# Patient Record
Sex: Female | Born: 1970 | Race: White | Hispanic: No | Marital: Married | Smoking: Never smoker
Health system: Southern US, Community
[De-identification: ages and names within clinical notes are randomized; demographics above are authoritative.]

## PROBLEM LIST (undated history)

## (undated) DIAGNOSIS — IMO0001 Reserved for inherently not codable concepts without codable children: Secondary | ICD-10-CM

## (undated) DIAGNOSIS — R51 Headache: Secondary | ICD-10-CM

## (undated) DIAGNOSIS — Z86718 Personal history of other venous thrombosis and embolism: Secondary | ICD-10-CM

## (undated) DIAGNOSIS — Z5189 Encounter for other specified aftercare: Secondary | ICD-10-CM

## (undated) DIAGNOSIS — R32 Unspecified urinary incontinence: Secondary | ICD-10-CM

## (undated) DIAGNOSIS — K589 Irritable bowel syndrome without diarrhea: Secondary | ICD-10-CM

## (undated) DIAGNOSIS — R112 Nausea with vomiting, unspecified: Secondary | ICD-10-CM

## (undated) DIAGNOSIS — N920 Excessive and frequent menstruation with regular cycle: Principal | ICD-10-CM

## (undated) DIAGNOSIS — S93699A Other sprain of unspecified foot, initial encounter: Secondary | ICD-10-CM

## (undated) DIAGNOSIS — M1990 Unspecified osteoarthritis, unspecified site: Secondary | ICD-10-CM

## (undated) DIAGNOSIS — F32A Depression, unspecified: Secondary | ICD-10-CM

## (undated) DIAGNOSIS — F419 Anxiety disorder, unspecified: Secondary | ICD-10-CM

## (undated) DIAGNOSIS — N3281 Overactive bladder: Secondary | ICD-10-CM

## (undated) DIAGNOSIS — K219 Gastro-esophageal reflux disease without esophagitis: Secondary | ICD-10-CM

## (undated) DIAGNOSIS — Z9889 Other specified postprocedural states: Secondary | ICD-10-CM

## (undated) DIAGNOSIS — M6701 Short Achilles tendon (acquired), right ankle: Secondary | ICD-10-CM

## (undated) DIAGNOSIS — N840 Polyp of corpus uteri: Secondary | ICD-10-CM

## (undated) DIAGNOSIS — Z8489 Family history of other specified conditions: Secondary | ICD-10-CM

## (undated) DIAGNOSIS — F329 Major depressive disorder, single episode, unspecified: Secondary | ICD-10-CM

## (undated) DIAGNOSIS — G2581 Restless legs syndrome: Secondary | ICD-10-CM

## (undated) HISTORY — DX: Anxiety disorder, unspecified: F41.9

## (undated) HISTORY — DX: Encounter for other specified aftercare: Z51.89

## (undated) HISTORY — PX: TUBAL LIGATION: SHX77

## (undated) HISTORY — PX: HERNIA REPAIR: SHX51

## (undated) HISTORY — DX: Headache: R51

## (undated) HISTORY — DX: Excessive and frequent menstruation with regular cycle: N92.0

## (undated) HISTORY — DX: Restless legs syndrome: G25.81

## (undated) HISTORY — PX: COLPOSCOPY: SHX161

## (undated) HISTORY — PX: CHOLECYSTECTOMY: SHX55

## (undated) HISTORY — PX: ENDOMETRIAL ABLATION: SHX621

## (undated) HISTORY — DX: Gastro-esophageal reflux disease without esophagitis: K21.9

## (undated) HISTORY — DX: Unspecified urinary incontinence: R32

## (undated) HISTORY — DX: Polyp of corpus uteri: N84.0

---

## 2000-10-16 ENCOUNTER — Encounter: Admission: RE | Admit: 2000-10-16 | Discharge: 2000-10-16 | Payer: Self-pay | Admitting: Internal Medicine

## 2000-10-16 ENCOUNTER — Encounter: Payer: Self-pay | Admitting: Internal Medicine

## 2004-04-25 ENCOUNTER — Other Ambulatory Visit: Admission: RE | Admit: 2004-04-25 | Discharge: 2004-04-25 | Payer: Self-pay | Admitting: Family Medicine

## 2005-06-12 ENCOUNTER — Other Ambulatory Visit: Admission: RE | Admit: 2005-06-12 | Discharge: 2005-06-12 | Payer: Self-pay | Admitting: Family Medicine

## 2007-01-29 ENCOUNTER — Encounter: Admission: RE | Admit: 2007-01-29 | Discharge: 2007-01-29 | Payer: Self-pay | Admitting: Family Medicine

## 2010-05-07 ENCOUNTER — Encounter: Payer: Self-pay | Admitting: Family Medicine

## 2011-08-06 ENCOUNTER — Other Ambulatory Visit (HOSPITAL_COMMUNITY)
Admission: RE | Admit: 2011-08-06 | Discharge: 2011-08-06 | Disposition: A | Discharge status: Home / Self Care | Payer: BC Managed Care – PPO | Source: Ambulatory Visit | Attending: Obstetrics and Gynecology | Admitting: Obstetrics and Gynecology

## 2011-08-06 ENCOUNTER — Other Ambulatory Visit: Payer: Self-pay | Admitting: Adult Health

## 2011-08-06 DIAGNOSIS — Z01419 Encounter for gynecological examination (general) (routine) without abnormal findings: Secondary | ICD-10-CM | POA: Insufficient documentation

## 2012-01-25 ENCOUNTER — Encounter (HOSPITAL_COMMUNITY): Payer: Self-pay | Admitting: *Deleted

## 2012-01-25 ENCOUNTER — Emergency Department (HOSPITAL_COMMUNITY)
Admission: EM | Admit: 2012-01-25 | Discharge: 2012-01-25 | Disposition: A | Discharge status: Home / Self Care | Payer: BC Managed Care – PPO | Attending: Emergency Medicine | Admitting: Emergency Medicine

## 2012-01-25 ENCOUNTER — Emergency Department (HOSPITAL_COMMUNITY): Payer: BC Managed Care – PPO

## 2012-01-25 DIAGNOSIS — E669 Obesity, unspecified: Secondary | ICD-10-CM | POA: Insufficient documentation

## 2012-01-25 DIAGNOSIS — R079 Chest pain, unspecified: Secondary | ICD-10-CM | POA: Insufficient documentation

## 2012-01-25 DIAGNOSIS — R0602 Shortness of breath: Secondary | ICD-10-CM | POA: Insufficient documentation

## 2012-01-25 DIAGNOSIS — R209 Unspecified disturbances of skin sensation: Secondary | ICD-10-CM | POA: Insufficient documentation

## 2012-01-25 LAB — TROPONIN I
Troponin I: <0.3 ng/mL (ref <0.30)
Troponin I: <0.3 ng/mL

## 2012-01-25 LAB — BASIC METABOLIC PANEL
BUN: 6 mg/dL (ref 6–23)
CO2: 24 mEq/L (ref 19–32)
Calcium: 8.9 mg/dL (ref 8.4–10.5)
Chloride: 104 mEq/L (ref 96–112)
Creatinine, Ser: 0.71 mg/dL (ref 0.50–1.10)
GFR calc Af Amer: >90 mL/min (ref >90)
GFR calc non Af Amer: >90 mL/min (ref >90)
Glucose, Bld: 89 mg/dL (ref 70–99)
Potassium: 3.4 mEq/L — ABNORMAL LOW (ref 3.5–5.1)
Sodium: 137 mEq/L (ref 135–145)

## 2012-01-25 LAB — CBC
HCT: 38.5 % (ref 36.0–46.0)
Hemoglobin: 12.7 g/dL (ref 12.0–15.0)
MCH: 27.3 pg (ref 26.0–34.0)
MCHC: 33 g/dL (ref 30.0–36.0)
MCV: 82.8 fL (ref 78.0–100.0)
Platelets: 255 10*3/uL (ref 150–400)
RBC: 4.65 MIL/uL (ref 3.87–5.11)
RDW: 13.9 % (ref 11.5–15.5)
WBC: 6.3 10*3/uL (ref 4.0–10.5)

## 2012-01-25 MED ORDER — ASPIRIN 81 MG PO CHEW
324.0000 mg | CHEWABLE_TABLET | Freq: Once | ORAL | Status: AC
  Administered 2012-01-25: 324 mg via ORAL
  Filled 2012-01-25: qty 4

## 2012-01-25 MED ORDER — SODIUM CHLORIDE 0.9 % IV BOLUS (SEPSIS)
1000.0000 mL | Freq: Once | INTRAVENOUS | Status: AC
  Administered 2012-01-25: 1000 mL via INTRAVENOUS

## 2012-01-25 MED ORDER — ASPIRIN 81 MG PO CHEW
CHEWABLE_TABLET | ORAL | Status: AC
  Filled 2012-01-25: qty 1

## 2012-01-25 MED ORDER — POTASSIUM CHLORIDE CRYS ER 20 MEQ PO TBCR
20.0000 meq | EXTENDED_RELEASE_TABLET | Freq: Once | ORAL | Status: AC
  Administered 2012-01-25: 20 meq via ORAL
  Filled 2012-01-25: qty 1

## 2012-01-25 NOTE — ED Notes (Signed)
Pt woke up at 3 am to use the bathroom; developed shaking all over; felt like she was going to pass out; had chest pain and shortness of breath; has continued to have chest tightness and feeling shaky

## 2012-01-25 NOTE — ED Provider Notes (Signed)
History    41 year old female with chest pain. Onset was around 3:00 this morning. Patient was awoken from her sleep with pressure sensation in the center of chest. Associated with sensation that her heart was racing, shortness of breath, tingling in hands and feeling that she may pass out. Symptoms slowly improved over the period of about half an hour. She is currently with no complaints. Felt unusally thirsty yesterday evening but otherwise in her usual state of health. Patient denies any significant past medical history. She is a nonsmoker. No history similar symptoms.Pt does have some LE pain but chronic in nature and has had for years. Denies new pain or change in character. No Hx of blood clot.   CSN: [id number]  Arrival date & time 01/25/12  0440   First MD Initiated Contact with Patient 01/25/12 0453      Chief Complaint  Patient presents with  . Chest Pain    (Consider location/radiation/quality/duration/timing/severity/associated sxs/prior treatment) HPI  History reviewed. No pertinent past medical history.  History reviewed. No pertinent past surgical history.  No family history on file.  History  Substance Use Topics  . Smoking status: Never Smoker   . Smokeless tobacco: Not on file  . Alcohol Use: No    OB History    Grav Para Term Preterm Abortions TAB SAB Ect Mult Living                  Review of Systems   Review of symptoms negative unless otherwise noted in HPI.   Allergies  Review of patient's allergies indicates no known allergies.  Home Medications  No current outpatient prescriptions on file.  BP 128/69  Pulse 69  Temp 97.6 F (36.4 C)  Resp 16  SpO2 99%  LMP 01/25/2012  Physical Exam  Nursing note and vitals reviewed. Constitutional: She appears well-developed and well-nourished. No distress.       Laying in bed. No acute distress. Obese.  HENT:  Head: Normocephalic and atraumatic.  Eyes: Conjunctivae normal are normal. Right eye  exhibits no discharge. Left eye exhibits no discharge.  Neck: Neck supple.  Cardiovascular: Normal rate, regular rhythm and normal heart sounds.  Exam reveals no gallop and no friction rub.   No murmur heard. Pulmonary/Chest: Effort normal and breath sounds normal. No respiratory distress. She exhibits no tenderness.  Abdominal: Soft. She exhibits no distension. There is no tenderness.  Musculoskeletal: She exhibits no edema and no tenderness.       Lower extremities symmetric as compared to each other. No calf tenderness. Negative Homan's. No palpable cords.   Neurological: She is alert.  Skin: Skin is warm and dry.  Psychiatric: She has a normal mood and affect. Her behavior is normal. Thought content normal.    ED Course  Procedures (including critical care time)  Labs Reviewed  BASIC METABOLIC PANEL - Abnormal; Notable for the following:    Potassium 3.4 (*)     All other components within normal limits  TROPONIN I  CBC  TROPONIN I   Dg Chest 2 View  01/25/2012  *RADIOLOGY REPORT*  Clinical Data: 41 year old female chest pain and shortness of breath.  CHEST - 2 VIEW  Comparison: None.  Findings: Somewhat low lung volumes, but the lungs are clear.  No pneumothorax or effusion.  Cardiac size and mediastinal contours are within normal limits.  Visualized tracheal air column is within normal limits.  No acute osseous abnormality identified.  Numerous surgical clips in the upper abdomen.  IMPRESSION: Negative, no acute cardiopulmonary abnormality.   Original Report Authenticated By: Harley Hallmark, M.D.    EKG: initial Rhythm: normal sinus Rate: 74 Axis: normal Intervals: normal ST segments: NS ST changes.  EKG: repeat Rhythm: sinus bradycardia Rate: 59 Axis: normal Intervals: normal ST segments:NS ST changes Comparison: Stable     1. Chest pain       MDM  41 year old female with chest pain. Consider ACS but my suspicion is low. I suspect some anxiety component.  EKG with a sinus rhythm, normal intervals and no ischemic changes. Repeat EKG stable. No significant risk factors for CAD aside from obesity. Doubt pulmonary embolism. Doubt infectious. Doubt dissection. Initial troponin is normal. Will check second troponin at 6 hours (0900). Feel that pt can be discharged if this normal with outpt FU with her PCP.    7:15 AM Pt reassessed. Remains without complaints.      Raeford Razor, MD 01/25/12 [phone]

## 2012-01-25 NOTE — ED Notes (Signed)
Pt remains on telemetry 

## 2012-01-25 NOTE — ED Provider Notes (Signed)
9:51 AM Pt continues to be CP free. Second troponin normal. Dc home  Lyanne Co, MD 01/25/12 [phone]

## 2012-01-31 ENCOUNTER — Other Ambulatory Visit (HOSPITAL_COMMUNITY): Payer: Self-pay | Admitting: Family Medicine

## 2012-01-31 DIAGNOSIS — R079 Chest pain, unspecified: Secondary | ICD-10-CM

## 2012-02-01 ENCOUNTER — Ambulatory Visit (HOSPITAL_COMMUNITY): Payer: BC Managed Care – PPO | Attending: Family Medicine | Admitting: Radiology

## 2012-02-01 DIAGNOSIS — R079 Chest pain, unspecified: Secondary | ICD-10-CM

## 2012-02-01 DIAGNOSIS — R0609 Other forms of dyspnea: Secondary | ICD-10-CM

## 2012-02-01 DIAGNOSIS — I079 Rheumatic tricuspid valve disease, unspecified: Secondary | ICD-10-CM | POA: Insufficient documentation

## 2012-02-01 DIAGNOSIS — R072 Precordial pain: Secondary | ICD-10-CM

## 2012-02-01 DIAGNOSIS — E785 Hyperlipidemia, unspecified: Secondary | ICD-10-CM | POA: Insufficient documentation

## 2012-02-01 DIAGNOSIS — I059 Rheumatic mitral valve disease, unspecified: Secondary | ICD-10-CM | POA: Insufficient documentation

## 2012-02-01 DIAGNOSIS — R0989 Other specified symptoms and signs involving the circulatory and respiratory systems: Secondary | ICD-10-CM

## 2012-02-01 NOTE — Progress Notes (Signed)
Echocardiogram performed.  

## 2012-02-04 ENCOUNTER — Encounter (HOSPITAL_COMMUNITY): Payer: Self-pay | Admitting: Family Medicine

## 2012-02-08 ENCOUNTER — Ambulatory Visit (INDEPENDENT_AMBULATORY_CARE_PROVIDER_SITE_OTHER): Payer: BC Managed Care – PPO | Admitting: Cardiology

## 2012-02-08 ENCOUNTER — Encounter: Payer: Self-pay | Admitting: Cardiology

## 2012-02-08 VITALS — BP 134/90 | HR 76 | Ht 65.0 in | Wt 229.8 lb

## 2012-02-08 DIAGNOSIS — R002 Palpitations: Secondary | ICD-10-CM

## 2012-02-08 DIAGNOSIS — I10 Essential (primary) hypertension: Secondary | ICD-10-CM | POA: Insufficient documentation

## 2012-02-08 DIAGNOSIS — R079 Chest pain, unspecified: Secondary | ICD-10-CM

## 2012-02-08 DIAGNOSIS — R0602 Shortness of breath: Secondary | ICD-10-CM

## 2012-02-08 DIAGNOSIS — R072 Precordial pain: Secondary | ICD-10-CM

## 2012-02-08 LAB — BRAIN NATRIURETIC PEPTIDE: Pro B Natriuretic peptide (BNP): 39 pg/mL (ref 0.0–100.0)

## 2012-02-08 NOTE — Progress Notes (Signed)
HPI The patient presents as a new patient for evaluation of chest discomfort and palpitations. On 1011 she woke suddenly with her heart racing. She tried to stand but was presyncopal. She had chest discomfort with this. He presented to was a long emergency room and I did review these records. Prior to presentation her son noted her heart rate to be too fast to count. She did have sharp mid chest discomfort with this. This subsequently resolved though she has some lingering chest pressure. She was very pale and shaky. In the emergency room she had no acute ST-T wave changes and enzymes were negative. She was not hospitalized. However, she did have an exercise treadmill test with her primary provider. She was found to have no ischemic ST-T wave changes though she did get short of breath she had chest discomfort. She had a poor exercise tolerance. Of note her blood pressure is elevated during the study. She has had some elevated blood pressures otherwise at home.  She does not exercise routinely though she does her chores at work. With this she has been getting more short of breath. She's been having some vague chest tightness. She denies any PND or orthopnea. She does say that her heart rate has been going up but it seems to happen after she gets her chest discomfort.  Of note I did review the results of an echocardiogram which demonstrated an EF of 55-60%. There were no valvular abnormalities.  No Known Allergies  Current Outpatient Prescriptions  Medication Sig Dispense Refill  . cyclobenzaprine (FLEXERIL) 5 MG tablet Take 5 mg by mouth as needed.       . diclofenac (VOLTAREN) 75 MG EC tablet Take 75 mg by mouth as needed.      . pantoprazole (PROTONIX) 40 MG tablet       . pramipexole (MIRAPEX) 0.5 MG tablet         Past Medical History  Diagnosis Date  . GERD (gastroesophageal reflux disease)   . RLS (restless legs syndrome)     Past Surgical History  Procedure Date  . Cholecystectomy     . Tubal ligation     Family History  Problem Relation Age of Onset  . CAD Father 67    CABG, stents  . Cancer Mother     Brain cancer resected    History   Social History  . Marital Status: Married    Spouse Name: N/A    Number of Children: 4  . Years of Education: N/A   Occupational History  .  [employer]   Social History Main Topics  . Smoking status: Never Smoker   . Smokeless tobacco: Not on file  . Alcohol Use: No  . Drug Use:   . Sexually Active:    Other Topics Concern  . Not on file   Social History Narrative   Lives at home with husband and four children.    Pharmacy tech.     ROS:  As stated in the HPI and negative for all other systems.  PHYSICAL EXAM BP 134/90  Pulse 76  Ht 5\' 5"  (1.651 m)  Wt 229 lb 12.8 oz (104.237 kg)  BMI 38.24 kg/m2  LMP 01/25/2012 GENERAL:  Well appearing HEENT:  Pupils equal round and reactive, fundi not visualized, oral mucosa unremarkable NECK:  No jugular venous distention, waveform within normal limits, carotid upstroke brisk and symmetric, no bruits, no thyromegaly LYMPHATICS:  No cervical, inguinal adenopathy LUNGS:  Clear to auscultation bilaterally BACK:  No CVA tenderness CHEST:  Unremarkable HEART:  PMI not displaced or sustained,S1 and S2 within normal limits, no S3, no S4, no clicks, no rubs, no murmurs ABD:  Flat, positive bowel sounds normal in frequency in pitch, no bruits, no rebound, no guarding, no midline pulsatile mass, no hepatomegaly, no splenomegaly EXT:  2 plus pulses throughout, no edema, no cyanosis no clubbing SKIN:  No rashes no nodules NEURO:  Cranial nerves II through XII grossly intact, motor grossly intact throughout PSYCH:  Cognitively intact, oriented to person place and time  EKG:  Sinus rhythm, rate 81, axis within normal limits, intervals within normal limits, no acute ST-T wave changes.  10/16  ASSESSMENT AND PLAN  CHEST PAIN I think this is unlikely to represent obstructive  coronary disease.  I wonder if this could be a primary arrhythmia and I will start with a 21 day event monitor.  HYPERTENSION I discussed keeping a blood pressure diary as this has intermittently been elevated.  DYSPNEA I will check a BNP level. I will further consider testing such as cardiopulmonary stress testing or pulmonary function testing.  TACHYCARDIA  I will check a 21 day event monitor.  OVERWEIGHT We will discuss weight loss strategies.

## 2012-02-08 NOTE — Patient Instructions (Addendum)
The current medical regimen is effective;  continue present plan and medications.  Your physician has recommended that you wear an event monitor for 21 days. Event monitors are medical devices that record the heart's electrical activity. Doctors most often [place] these monitors to diagnose arrhythmias. Arrhythmias are problems with the speed or rhythm of the heartbeat. The monitor is a small, portable device. You can wear one while you do your normal daily activities. This is usually used to diagnose what is causing palpitations/syncope (passing out).  Please keep a blood pressure diary and bring to your next appointment.  Follow up to be schedule either in the [facility] or [place] office.

## 2012-02-19 DIAGNOSIS — R0602 Shortness of breath: Secondary | ICD-10-CM

## 2012-02-19 DIAGNOSIS — R079 Chest pain, unspecified: Secondary | ICD-10-CM

## 2012-02-21 ENCOUNTER — Encounter: Payer: Self-pay | Admitting: Cardiology

## 2012-03-19 ENCOUNTER — Ambulatory Visit (INDEPENDENT_AMBULATORY_CARE_PROVIDER_SITE_OTHER): Payer: BC Managed Care – PPO | Admitting: Cardiology

## 2012-03-19 ENCOUNTER — Encounter: Payer: Self-pay | Admitting: Cardiology

## 2012-03-19 VITALS — BP 109/63 | HR 70 | Ht 65.0 in | Wt 229.0 lb

## 2012-03-19 DIAGNOSIS — R072 Precordial pain: Secondary | ICD-10-CM

## 2012-03-19 DIAGNOSIS — R002 Palpitations: Secondary | ICD-10-CM

## 2012-03-19 DIAGNOSIS — I10 Essential (primary) hypertension: Secondary | ICD-10-CM

## 2012-03-19 NOTE — Progress Notes (Signed)
    HPI The patient presents as a new patient for evaluation of chest discomfort and palpitations. Since I last saw her she did wear an event monitor which demonstrated no dysrhythmias. She brings a blood pressure diary today which demonstrates normal blood pressure readings.  She reports that she's had none of the severe palpitations such as described previously. She has continued to get a couple of episodes weekly of weakness or palpitations. She transmitted several of these episodes but they were associated only with sinus rhythm. She says she's trying to diet and is down to 1000 calories per day and overweight hasn't changed. She's had no new chest pressure, neck or arm discomfort. She has had no new shortness of breath, PND or orthopnea.  No Known Allergies  Current Outpatient Prescriptions  Medication Sig Dispense Refill  . cyclobenzaprine (FLEXERIL) 5 MG tablet Take 5 mg by mouth as needed.       . diclofenac (VOLTAREN) 75 MG EC tablet Take 75 mg by mouth as needed.      . pantoprazole (PROTONIX) 40 MG tablet Take 40 mg by mouth daily.       . pramipexole (MIRAPEX) 0.5 MG tablet Take 1/2 tab at night for restless leg syndrome        Past Medical History  Diagnosis Date  . GERD (gastroesophageal reflux disease)   . RLS (restless legs syndrome)     Past Surgical History  Procedure Date  . Cholecystectomy   . Tubal ligation     ROS:  As stated in the HPI and negative for all other systems.  PHYSICAL EXAM BP 109/63  Pulse 70  Ht 5\' 5"  (1.651 m)  Wt 229 lb (103.874 kg)  BMI 38.11 kg/m2 GENERAL:  Well appearing NECK:  No jugular venous distention, waveform within normal limits, carotid upstroke brisk and symmetric, no bruits, no thyromegaly LUNGS:  Clear to auscultation bilaterally CHEST:  Unremarkable HEART:  PMI not displaced or sustained,S1 and S2 within normal limits, no S3, no S4, no clicks, no rubs, no murmurs ABD:  Flat, positive bowel sounds normal in frequency in  pitch, no bruits, no rebound, no guarding, no midline pulsatile mass, no hepatomegaly, no splenomegaly EXT:  2 plus pulses throughout, trace edema, no cyanosis no clubbing  EKG:  Sinus rhythm, rate 81, axis within normal limits, intervals within normal limits, no acute ST-T wave changes.  10/16  ASSESSMENT AND PLAN  CHEST PAIN I did review the actual tracings from her recent exercise treadmill test and it was normal though she had a poor exercise tolerance. However, I think the possibility of obstructive coronary disease is extremely low. No further cardiovascular testing is suggested.  HYPERTENSION This seems to be well controlled. She will continue the meds as listed.  DYSPNEA Since her.symptoms are slightly better I will not consider further testing. We discussed instead a slowly graduated exercise regimen and a low carbohydrate diet. If her symptoms don't improve with this I would further evaluate probably with cardiopulmonary stress testing.   TACHYCARDIA  there does not appear to be any obvious tachyarrhythmias. No change in therapy is indicated.  OVERWEIGHT As above.  EDEMA She indicates some increased lower extremity swelling by the end of the day. She says she limits her salt. She would like to avoid medications. We will start with compression stockings as well as a walking regimen and weight loss.

## 2012-05-31 ENCOUNTER — Other Ambulatory Visit: Payer: Self-pay

## 2012-08-06 ENCOUNTER — Encounter: Payer: Self-pay | Admitting: *Deleted

## 2012-08-06 DIAGNOSIS — G43909 Migraine, unspecified, not intractable, without status migrainosus: Secondary | ICD-10-CM | POA: Insufficient documentation

## 2012-08-07 ENCOUNTER — Ambulatory Visit: Payer: Self-pay | Admitting: Adult Health

## 2012-08-12 ENCOUNTER — Other Ambulatory Visit: Payer: Self-pay | Admitting: Nurse Practitioner

## 2012-09-19 ENCOUNTER — Telehealth: Payer: Self-pay | Admitting: Adult Health

## 2012-09-19 NOTE — Telephone Encounter (Signed)
Having irregular bleeding about every 2 weeks and her periods are heavier, make appt

## 2012-09-26 ENCOUNTER — Ambulatory Visit (INDEPENDENT_AMBULATORY_CARE_PROVIDER_SITE_OTHER): Payer: BC Managed Care – PPO | Admitting: Adult Health

## 2012-09-26 ENCOUNTER — Other Ambulatory Visit: Payer: Self-pay | Admitting: Obstetrics & Gynecology

## 2012-09-26 ENCOUNTER — Other Ambulatory Visit (INDEPENDENT_AMBULATORY_CARE_PROVIDER_SITE_OTHER): Payer: BC Managed Care – PPO

## 2012-09-26 ENCOUNTER — Encounter: Payer: Self-pay | Admitting: Adult Health

## 2012-09-26 VITALS — BP 120/90 | Ht 65.0 in | Wt 212.0 lb

## 2012-09-26 DIAGNOSIS — N840 Polyp of corpus uteri: Secondary | ICD-10-CM

## 2012-09-26 DIAGNOSIS — N926 Irregular menstruation, unspecified: Secondary | ICD-10-CM

## 2012-09-26 DIAGNOSIS — N920 Excessive and frequent menstruation with regular cycle: Secondary | ICD-10-CM

## 2012-09-26 HISTORY — DX: Excessive and frequent menstruation with regular cycle: N92.0

## 2012-09-26 HISTORY — DX: Polyp of corpus uteri: N84.0

## 2012-09-26 NOTE — Patient Instructions (Addendum)
Menorrhagia Dysfunctional uterine bleeding is different from a normal menstrual period. When periods are heavy or there is more bleeding than is usual for you, it is called menorrhagia. It may be caused by hormonal imbalance, or physical, metabolic, or other problems. Examination is necessary in order that your caregiver may treat treatable causes. If this is a continuing problem, a D&C may be needed. That means that the cervix (the opening of the uterus or womb) is dilated (stretched larger) and the lining of the uterus is scraped out. The tissue scraped out is then examined under a microscope by a specialist (pathologist) to make sure there is nothing of concern that needs further or more extensive treatment. HOME CARE INSTRUCTIONS   If medications were prescribed, take exactly as directed. Do not change or switch medications without consulting your caregiver.  Long term heavy bleeding may result in iron deficiency. Your caregiver may have prescribed iron pills. They help replace the iron your body lost from heavy bleeding. Take exactly as directed. Iron may cause constipation. If this becomes a problem, increase the bran, fruits, and roughage in your diet.  Do not take aspirin or medicines that contain aspirin one week before or during your menstrual period. Aspirin may make the bleeding worse.  If you need to change your sanitary pad or tampon more than once every 2 hours, stay in bed and rest as much as possible until the bleeding stops.  Eat well-balanced meals. Eat foods high in iron. Examples are leafy green vegetables, meat, liver, eggs, and whole grain breads and cereals. Do not try to lose weight until the abnormal bleeding has stopped and your blood iron level is back to normal. SEEK MEDICAL CARE IF:   You need to change your sanitary pad or tampon more than once an hour.  You develop nausea (feeling sick to your stomach) and vomiting, dizziness, or diarrhea while you are taking your  medicine.  You have any problems that may be related to the medicine you are taking. SEEK IMMEDIATE MEDICAL CARE IF:   You have a fever.  You develop chills.  You develop severe bleeding or start to pass blood clots.  You feel dizzy or faint. MAKE SURE YOU:   Understand these instructions.  Will watch your condition.  Will get help right away if you are not doing well or get worse. Document Released: 04/02/2005 Document Revised: 06/25/2011 Document Reviewed: 11/21/2007 [facility] Patient Information 2014 [place], [place]. Return in 1 week

## 2012-09-26 NOTE — Progress Notes (Signed)
Subjective:     Patient ID: Margaret Cruz, female   DOB: [birth year], 42 y.o.   MRN: [id number]  HPI Margaret Cruz is in complaining of a period every 2 weeks and heavy and an [place] was obtained  Review of Systems Positives as in HPI     Objective:   Physical Exam BP 120/90  Ht 5\' 5"  (1.651 m)  Wt 212 lb (96.163 kg)  BMI 35.28 kg/m2  LMP 06/06/2014Talk only. Reviewed [place] and discussed with DrFerguson.The US shows an endometrial polyp and will need to have it removed.     Assessment:     Menorrhagia Endometrial poylp    Plan:      Return in 1 week for pre op with Dr.Ferguson  Review handout on menorrhagia

## 2012-09-29 ENCOUNTER — Other Ambulatory Visit: Payer: Self-pay | Admitting: Adult Health

## 2012-10-03 ENCOUNTER — Encounter: Payer: Self-pay | Admitting: Obstetrics and Gynecology

## 2012-10-03 ENCOUNTER — Ambulatory Visit (INDEPENDENT_AMBULATORY_CARE_PROVIDER_SITE_OTHER): Payer: BC Managed Care – PPO | Admitting: Obstetrics and Gynecology

## 2012-10-03 VITALS — BP 120/74 | Ht 65.0 in | Wt 220.6 lb

## 2012-10-03 DIAGNOSIS — N840 Polyp of corpus uteri: Secondary | ICD-10-CM

## 2012-10-03 DIAGNOSIS — Z01818 Encounter for other preprocedural examination: Secondary | ICD-10-CM

## 2012-10-03 DIAGNOSIS — N920 Excessive and frequent menstruation with regular cycle: Secondary | ICD-10-CM

## 2012-10-03 NOTE — Progress Notes (Signed)
Patient ID: Margaret Cruz, female   DOB: [birth year], 42 y.o.   MRN: [id number]   [facility] Visit  Patient name: Margaret Cruz MRN [id number]  Date of birth: 1970/06/07  CC & HPI:  Margaret Cruz is a 42 y.o. female presenting today for  preop Pt here today for pre op visit. Pt states she is going to have a polyp removed and possibly a D&C. And Ablation. ROS:  Positive for urinary urgency 5-6 times per night as well as during the day, tried on oxybutynin without good results. Patient denies stress incontinence Periods are heavy 6-7 days with 3 days better particularly heavy requiring up to 48 pads in 24 hours  Pertinent History Reviewed:  Medical & Surgical Hx:  Reviewed: Significant for good overall health Medications: Reviewed & Updated - see associated section Social History: Reviewed -  reports that she has never smoked. She has never used smokeless tobacco. married stable relationship x 27 year Family history a concern to the patient due to at least 2 patient family members who allegedly had cervical cancer.  Objective Findings:  Vitals: BP 120/74  Ht 5\' 5"  (1.651 m)  Wt 220 lb 9.6 oz (100.064 kg)  BMI 36.71 kg/m2  LMP 09/19/2012  Physical Examination: General appearance - alert, well appearing, and in no distress, oriented to person, place, and time and overweight Mental status - alert, oriented to person, place, and time, normal mood, behavior, speech, dress, motor activity, and thought processes, conservative attire Eyes - pupils equal and reactive, extraocular eye movements intact, sclera anicteric Neck - supple, no significant adenopathy Chest - clear to auscultation, no wheezes, rales or rhonchi, symmetric air entry Heart - normal rate and regular rhythm Abdomen - soft, nontender, nondistended, no masses or organomegaly Pelvic - normal external genitalia, vulva, vagina, cervix, uterus and adnexa, UTERUS: uterus is normal size, shape, consistency and  nontender, enlarged to 6-8 week's size, first-degree uterine descensus only Extremities - peripheral pulses normal, no pedal edema, no clubbing or cyanosis, Homan's sign negative bilaterally  Ultrasound done in the office from last week has been reviewed. It shows a distinct circular area consistent with a small endometrial polyp and an otherwise normal appearing endometrium. Discussed the pros and cons of endometrial biopsy prior to procedure, and patient is in preference of doing the biopsy as part of the [facility] and polypectomy.    Assessment & Plan:   Menorrhagia with endometrial polyp Urge incontinence Plan: Will call in Myrbetriq Patient counseled at length regarding surgical options. Will schedule for hysteroscopy dilation and curettage removal of endometrial polyp and endometrial ablation with ThermaChoice endometrial ablation device  will have the on the little contact with patient and scheduled procedure as soon as possible patient does not wish to wait until her next cycle

## 2012-10-03 NOTE — Patient Instructions (Signed)
Endometrial Ablation Endometrial ablation removes the lining of the uterus (endometrium). It is usually a same day, outpatient treatment. Ablation helps avoid major surgery (such as a hysterectomy). A hysterectomy is removal of the cervix and uterus. Endometrial ablation has less risk and complications, has a shorter recovery period and is less expensive. After endometrial ablation, most women will have little or no menstrual bleeding. You may not keep your fertility. Pregnancy is no longer likely after this procedure but if you are pre-menopausal, you still need to use a reliable method of birth control following the procedure because pregnancy can occur. REASONS TO HAVE THE PROCEDURE MAY INCLUDE:  Heavy periods.  Bleeding that is causing anemia.  Anovulatory bleeding, very irregular, bleeding.  Bleeding submucous fibroids (on the lining inside the uterus) if they are smaller than 3 centimeters. REASONS NOT TO HAVE THE PROCEDURE MAY INCLUDE:  You wish to have more children.  You have a pre-cancerous or cancerous problem. The cause of any abnormal bleeding must be diagnosed before having the procedure.  You have pain coming from the uterus.  You have a submucus fibroid larger than 3 centimeters.  You recently had a baby.  You recently had an infection in the uterus.  You have a severe retro-flexed, tipped uterus and cannot insert the instrument to do the ablation.  You had a Cesarean section or deep major surgery on the uterus.  The inner cavity of the uterus is too large for the endometrial ablation instrument. RISKS AND COMPLICATIONS   Perforation of the uterus.  Bleeding.  Infection of the uterus, bladder or vagina.  Injury to surrounding organs.  Cutting the cervix.  An air bubble to the lung (air embolus).  Pregnancy following the procedure.  Failure of the procedure to help the problem requiring hysterectomy.  Decreased ability to diagnose cancer in the lining of  the uterus. BEFORE THE PROCEDURE  The lining of the uterus must be tested to make sure there is no pre-cancerous or cancer cells present.  Medications may be given to make the lining of the uterus thinner.  Ultrasound may be used to evaluate the size and look for abnormalities of the uterus.  Future pregnancy is not desired. PROCEDURE  There are different ways to destroy the lining of the uterus.   Resectoscope - radio frequency-alternating electric current is the most common one used.  Cryotherapy - freezing the lining of the uterus.  Heated Free Liquid - heated salt (saline) solution inserted into the uterus.  Microwave - uses high energy microwaves in the uterus.  Thermal Balloon - a catheter with a balloon tip is inserted into the uterus and filled with heated fluid. Your caregiver will talk with you about the method used in this clinic. They will also instruct you on the pros and cons of the procedure. Endometrial ablation is performed along with a procedure called operative hysteroscopy. A narrow viewing tube is inserted through the birth canal (vagina) and through the cervix into the uterus. A tiny camera attached to the viewing tube (hysteroscope) allows the uterine cavity to be shown on a TV monitor during surgery. Your uterus is filled with a harmless liquid to make the procedure easier. The lining of the uterus is then removed. The lining can also be removed with a resectoscope which allows your surgeon to cut away the lining of the uterus under direct vision. Usually, you will be able to go home within an hour after the procedure. HOME CARE INSTRUCTIONS   Do   not drive for 24 hours.  No tampons, douching or intercourse for 2 weeks or until your caregiver approves.  Rest at home for 24 to 48 hours. You may then resume normal activities unless told differently by your caregiver.  Take your temperature two times a day for 4 days, and record it.  Take any medications your  caregiver has ordered, as directed.  Use some form of contraception if you are pre-menopausal and do not want to get pregnant. Bleeding after the procedure is normal. It varies from light spotting and mildly watery to bloody discharge for 4 to 6 weeks. You may also have mild cramping. Only take over-the-counter or prescription medicines for pain, discomfort, or fever as directed by your caregiver. Do not use aspirin, as this may aggravate bleeding. Frequent urination during the first 24 hours is normal. You will not know how effective your surgery is until at least 3 months after the surgery. SEEK IMMEDIATE MEDICAL CARE IF:   Bleeding is heavier than a normal menstrual cycle.  An oral temperature above 102 F (38.9 C) develops.  You have increasing cramps or pains not relieved with medication or develop belly (abdominal) pain which does not seem to be related to the same area of earlier cramping and pain.  You are light headed, weak or have fainting episodes.  You develop pain in the shoulder strap areas.  You have chest or leg pain.  You have abnormal vaginal discharge.  You have painful urination. Document Released: 02/10/2004 Document Revised: 06/25/2011 Document Reviewed: 05/10/2007 [facility] Patient Information 2014 [place], [place]. Hysteroscopy Hysteroscopy is a procedure used for looking inside the womb (uterus). It may be done for many different reasons, including:  To evaluate abnormal bleeding, fibroid (benign, noncancerous) tumors, polyps, scar tissue (adhesions), and possibly cancer of the uterus.  To look for lumps (tumors) and other uterine growths.  To look for causes of why a woman cannot get pregnant (infertility), causes of recurrent loss of pregnancy (miscarriages), or a lost intrauterine device (IUD).  To perform a sterilization by blocking the fallopian tubes from inside the uterus. A hysteroscopy should be done right after a menstrual period to be sure you are  not pregnant. LET YOUR CAREGIVER KNOW ABOUT:   Allergies.  Medicines taken, including herbs, eyedrops, over-the-counter medicines, and creams.  Use of steroids (by mouth or creams).  Previous problems with anesthetics or numbing medicines.  History of bleeding or blood problems.  History of blood clots.  Possibility of pregnancy, if this applies.  Previous surgery.  Other health problems. RISKS AND COMPLICATIONS   Putting a hole in the uterus.  Excessive bleeding.  Infection.  Damage to the cervix.  Injury to other organs.  Allergic reaction to medicines.  Too much fluid used in the uterus for the procedure. BEFORE THE PROCEDURE   Do not take aspirin or blood thinners for a week before the procedure, or as directed. It can cause bleeding.  Arrive at least 60 minutes before the procedure or as directed to read and sign the necessary forms.  Arrange for someone to take you home after the procedure.  If you smoke, do not smoke for 2 weeks before the procedure. PROCEDURE   Your caregiver may give you medicine to relax you. He or she may also give you a medicine that numbs the area around the cervix (local anesthetic) or a medicine that makes you sleep (general anesthesia).  Sometimes, a medicine is placed in the cervix the day before the  procedure. This medicine makes the cervix have a larger opening (dilate). This makes it easier for the instrument to be inserted into the uterus.  A small instrument (hysteroscope) is inserted through the vagina into the uterus. This instrument is similar to a pencil-sized telescope with a light.  During the procedure, air or a liquid is put into the uterus, which allows the surgeon to see better.  Sometimes, tissue is gently scraped from inside the uterus. These tissue samples are sent to a specialist who looks at tissue samples (pathologist). The pathologist will give a report to your caregiver. This will help your caregiver decide  if further treatment is necessary. The report will also help your caregiver decide on the best treatment if the test comes back abnormal. AFTER THE PROCEDURE   If you had a general anesthetic, you may be groggy for a couple hours after the procedure.  If you had a local anesthetic, you will be advised to rest at the surgical center or caregiver's office until you are stable and feel ready to go home.  You may have some cramping for a couple days.  You may have bleeding, which varies from light spotting for a few days to menstrual-like bleeding for up to 3 to 7 days. This is normal.  Have someone take you home. FINDING OUT THE RESULTS OF YOUR TEST Not all test results are available during your visit. If your test results are not back during the visit, make an appointment with your caregiver to find out the results. Do not assume everything is normal if you have not heard from your caregiver or the medical facility. It is important for you to follow up on all of your test results. HOME CARE INSTRUCTIONS   Do not drive for 24 hours or as instructed.  Only take over-the-counter or prescription medicines for pain, discomfort, or fever as directed by your caregiver.  Do not take aspirin. It can cause or aggravate bleeding.  Do not drive or drink alcohol while taking pain medicine.  You may resume your usual diet.  Do not use tampons, douche, or have sexual intercourse for 2 weeks, or as advised by your caregiver.  Rest and sleep for the first 24 to 48 hours.  Take your temperature twice a day for 4 to 5 days. Write it down. Give these temperatures to your caregiver if they are abnormal (above 98.6 F or 37.0 C).  Take medicines your caregiver has ordered as directed.  Follow your caregiver's advice regarding diet, exercise, lifting, driving, and general activities.  Take showers instead of baths for 2 weeks, or as recommended by your caregiver.  If you develop constipation:  Take a  mild laxative with the advice of your caregiver.  Eat bran foods.  Drink enough water and fluids to keep your urine clear or pale yellow.  Try to have someone with you or available to you for the first 24 to 48 hours, especially if you had a general anesthetic.  Make sure you and your family understand everything about your operation and recovery.  Follow your caregiver's advice regarding follow-up appointments and Pap smears. SEEK MEDICAL CARE IF:   You feel dizzy or lightheaded.  You feel sick to your stomach (nauseous).  You develop abnormal vaginal discharge.  You develop a rash.  You have an abnormal reaction or allergy to your medicine.  You need stronger pain medicine. SEEK IMMEDIATE MEDICAL CARE IF:   Bleeding is heavier than a normal menstrual period  or you have blood clots.  You have an oral temperature above 102 F (38.9 C), not controlled by medicine.  You have increasing cramps or pains not relieved with medicine.  You develop belly (abdominal) pain that does not seem to be related to the same area of earlier cramping and pain.  You pass out.  You develop pain in the tops of your shoulders (shoulder strap areas).  You develop shortness of breath. MAKE SURE YOU:   Understand these instructions.  Will watch your condition.  Will get help right away if you are not doing well or get worse. Document Released: 07/09/2000 Document Revised: 06/25/2011 Document Reviewed: 11/01/2008 [facility] Patient Information 2014 [place], [place].

## 2012-10-04 LAB — GC/CHLAMYDIA PROBE AMP: GC Probe RNA: NEGATIVE

## 2012-10-06 ENCOUNTER — Telehealth: Payer: Self-pay | Admitting: Obstetrics and Gynecology

## 2012-10-06 MED ORDER — MIRABEGRON ER 25 MG PO TB24: 25.0000 mg | ORAL_TABLET | Freq: Every day | ORAL | Status: DC

## 2012-10-06 NOTE — Telephone Encounter (Signed)
Pt states has not received RX for Myrbetriq. RX  e-scribed by Dr. Emelda Fear, pt notified.

## 2012-10-06 NOTE — Addendum Note (Signed)
Addended by: Tilda Burrow on: 10/06/2012 02:20 PM   Modules accepted: Orders, Medications

## 2012-10-09 ENCOUNTER — Encounter (HOSPITAL_COMMUNITY): Payer: Self-pay | Admitting: Pharmacy Technician

## 2012-10-13 NOTE — Patient Instructions (Addendum)
Margaret Cruz  10/13/2012   Your procedure is scheduled on:  10/21/2012  Report to [facility] at  615  AM.  Call this number if you have problems the morning of surgery: [phone]   Remember:   Do not eat food or drink liquids after midnight.   Take these medicines the morning of surgery with A SIP OF WATER:  protonix   Do not wear jewelry, make-up or nail polish.  Do not wear lotions, powders, or perfumes.   Do not shave 48 hours prior to surgery. Men may shave face and neck.  Do not bring valuables to the hospital.  [facility] is not responsible  for any belongings or valuables.  Contacts, dentures or bridgework may not be worn into surgery.  Leave suitcase in the car. After surgery it may be brought to your room.  For patients admitted to the hospital, checkout time is 11:00 AM the day of discharge.   Patients discharged the day of surgery will not be allowed to drive home.  Name and phone number of your driver: family  Special Instructions: Shower using CHG 2 nights before surgery and the night before surgery.  If you shower the day of surgery use CHG.  Use special wash - you have one bottle of CHG for all showers.  You should use approximately 1/3 of the bottle for each shower.   Please read over the following fact sheets that you were given: Pain Booklet, Coughing and Deep Breathing, MRSA Information, Surgical Site Infection Prevention, Anesthesia Post-op Instructions and Care and Recovery After Surgery Endometrial Ablation Endometrial ablation removes the lining of the uterus (endometrium). It is usually a same day, outpatient treatment. Ablation helps avoid major surgery (such as a hysterectomy). A hysterectomy is removal of the cervix and uterus. Endometrial ablation has less risk and complications, has a shorter recovery period and is less expensive. After endometrial ablation, most women will have little or no menstrual bleeding. You may not keep your fertility.  Pregnancy is no longer likely after this procedure but if you are pre-menopausal, you still need to use a reliable method of birth control following the procedure because pregnancy can occur. REASONS TO HAVE THE PROCEDURE MAY INCLUDE:  Heavy periods.  Bleeding that is causing anemia.  Anovulatory bleeding, very irregular, bleeding.  Bleeding submucous fibroids (on the lining inside the uterus) if they are smaller than 3 centimeters. REASONS NOT TO HAVE THE PROCEDURE MAY INCLUDE:  You wish to have more children.  You have a pre-cancerous or cancerous problem. The cause of any abnormal bleeding must be diagnosed before having the procedure.  You have pain coming from the uterus.  You have a submucus fibroid larger than 3 centimeters.  You recently had a baby.  You recently had an infection in the uterus.  You have a severe retro-flexed, tipped uterus and cannot insert the instrument to do the ablation.  You had a Cesarean section or deep major surgery on the uterus.  The inner cavity of the uterus is too large for the endometrial ablation instrument. RISKS AND COMPLICATIONS   Perforation of the uterus.  Bleeding.  Infection of the uterus, bladder or vagina.  Injury to surrounding organs.  Cutting the cervix.  An air bubble to the lung (air embolus).  Pregnancy following the procedure.  Failure of the procedure to help the problem requiring hysterectomy.  Decreased ability to diagnose cancer in the lining of the  uterus. BEFORE THE PROCEDURE  The lining of the uterus must be tested to make sure there is no pre-cancerous or cancer cells present.  Medications may be given to make the lining of the uterus thinner.  Ultrasound may be used to evaluate the size and look for abnormalities of the uterus.  Future pregnancy is not desired. PROCEDURE  There are different ways to destroy the lining of the uterus.   Resectoscope - radio frequency-alternating electric  current is the most common one used.  Cryotherapy - freezing the lining of the uterus.  Heated Free Liquid - heated salt (saline) solution inserted into the uterus.  Microwave - uses high energy microwaves in the uterus.  Thermal Balloon - a catheter with a balloon tip is inserted into the uterus and filled with heated fluid. Your caregiver will talk with you about the method used in this clinic. They will also instruct you on the pros and cons of the procedure. Endometrial ablation is performed along with a procedure called operative hysteroscopy. A narrow viewing tube is inserted through the birth canal (vagina) and through the cervix into the uterus. A tiny camera attached to the viewing tube (hysteroscope) allows the uterine cavity to be shown on a TV monitor during surgery. Your uterus is filled with a harmless liquid to make the procedure easier. The lining of the uterus is then removed. The lining can also be removed with a resectoscope which allows your surgeon to cut away the lining of the uterus under direct vision. Usually, you will be able to go home within an hour after the procedure. HOME CARE INSTRUCTIONS   Do not drive for 24 hours.  No tampons, douching or intercourse for 2 weeks or until your caregiver approves.  Rest at home for 24 to 48 hours. You may then resume normal activities unless told differently by your caregiver.  Take your temperature two times a day for 4 days, and record it.  Take any medications your caregiver has ordered, as directed.  Use some form of contraception if you are pre-menopausal and do not want to get pregnant. Bleeding after the procedure is normal. It varies from light spotting and mildly watery to bloody discharge for 4 to 6 weeks. You may also have mild cramping. Only take over-the-counter or prescription medicines for pain, discomfort, or fever as directed by your caregiver. Do not use aspirin, as this may aggravate bleeding. Frequent  urination during the first 24 hours is normal. You will not know how effective your surgery is until at least 3 months after the surgery. SEEK IMMEDIATE MEDICAL CARE IF:   Bleeding is heavier than a normal menstrual cycle.  An oral temperature above 102 F (38.9 C) develops.  You have increasing cramps or pains not relieved with medication or develop belly (abdominal) pain which does not seem to be related to the same area of earlier cramping and pain.  You are light headed, weak or have fainting episodes.  You develop pain in the shoulder strap areas.  You have chest or leg pain.  You have abnormal vaginal discharge.  You have painful urination. Document Released: 02/10/2004 Document Revised: 06/25/2011 Document Reviewed: 05/10/2007 [facility] Patient Information 2014 [place], [place]. Hysteroscopy Hysteroscopy is a procedure used for looking inside the womb (uterus). It may be done for many different reasons, including:  To evaluate abnormal bleeding, fibroid (benign, noncancerous) tumors, polyps, scar tissue (adhesions), and possibly cancer of the uterus.  To look for lumps (tumors) and other uterine  growths.  To look for causes of why a woman cannot get pregnant (infertility), causes of recurrent loss of pregnancy (miscarriages), or a lost intrauterine device (IUD).  To perform a sterilization by blocking the fallopian tubes from inside the uterus. A hysteroscopy should be done right after a menstrual period to be sure you are not pregnant. LET YOUR CAREGIVER KNOW ABOUT:   Allergies.  Medicines taken, including herbs, eyedrops, over-the-counter medicines, and creams.  Use of steroids (by mouth or creams).  Previous problems with anesthetics or numbing medicines.  History of bleeding or blood problems.  History of blood clots.  Possibility of pregnancy, if this applies.  Previous surgery.  Other health problems. RISKS AND COMPLICATIONS   Putting a hole in the  uterus.  Excessive bleeding.  Infection.  Damage to the cervix.  Injury to other organs.  Allergic reaction to medicines.  Too much fluid used in the uterus for the procedure. BEFORE THE PROCEDURE   Do not take aspirin or blood thinners for a week before the procedure, or as directed. It can cause bleeding.  Arrive at least 60 minutes before the procedure or as directed to read and sign the necessary forms.  Arrange for someone to take you home after the procedure.  If you smoke, do not smoke for 2 weeks before the procedure. PROCEDURE   Your caregiver may give you medicine to relax you. He or she may also give you a medicine that numbs the area around the cervix (local anesthetic) or a medicine that makes you sleep (general anesthesia).  Sometimes, a medicine is placed in the cervix the day before the procedure. This medicine makes the cervix have a larger opening (dilate). This makes it easier for the instrument to be inserted into the uterus.  A small instrument (hysteroscope) is inserted through the vagina into the uterus. This instrument is similar to a pencil-sized telescope with a light.  During the procedure, air or a liquid is put into the uterus, which allows the surgeon to see better.  Sometimes, tissue is gently scraped from inside the uterus. These tissue samples are sent to a specialist who looks at tissue samples (pathologist). The pathologist will give a report to your caregiver. This will help your caregiver decide if further treatment is necessary. The report will also help your caregiver decide on the best treatment if the test comes back abnormal. AFTER THE PROCEDURE   If you had a general anesthetic, you may be groggy for a couple hours after the procedure.  If you had a local anesthetic, you will be advised to rest at the surgical center or caregiver's office until you are stable and feel ready to go home.  You may have some cramping for a couple  days.  You may have bleeding, which varies from light spotting for a few days to menstrual-like bleeding for up to 3 to 7 days. This is normal.  Have someone take you home. FINDING OUT THE RESULTS OF YOUR TEST Not all test results are available during your visit. If your test results are not back during the visit, make an appointment with your caregiver to find out the results. Do not assume everything is normal if you have not heard from your caregiver or the medical facility. It is important for you to follow up on all of your test results. HOME CARE INSTRUCTIONS   Do not drive for 24 hours or as instructed.  Only take over-the-counter or prescription medicines for pain, discomfort,  or fever as directed by your caregiver.  Do not take aspirin. It can cause or aggravate bleeding.  Do not drive or drink alcohol while taking pain medicine.  You may resume your usual diet.  Do not use tampons, douche, or have sexual intercourse for 2 weeks, or as advised by your caregiver.  Rest and sleep for the first 24 to 48 hours.  Take your temperature twice a day for 4 to 5 days. Write it down. Give these temperatures to your caregiver if they are abnormal (above 98.6 F or 37.0 C).  Take medicines your caregiver has ordered as directed.  Follow your caregiver's advice regarding diet, exercise, lifting, driving, and general activities.  Take showers instead of baths for 2 weeks, or as recommended by your caregiver.  If you develop constipation:  Take a mild laxative with the advice of your caregiver.  Eat bran foods.  Drink enough water and fluids to keep your urine clear or pale yellow.  Try to have someone with you or available to you for the first 24 to 48 hours, especially if you had a general anesthetic.  Make sure you and your family understand everything about your operation and recovery.  Follow your caregiver's advice regarding follow-up appointments and Pap smears. SEEK  MEDICAL CARE IF:   You feel dizzy or lightheaded.  You feel sick to your stomach (nauseous).  You develop abnormal vaginal discharge.  You develop a rash.  You have an abnormal reaction or allergy to your medicine.  You need stronger pain medicine. SEEK IMMEDIATE MEDICAL CARE IF:   Bleeding is heavier than a normal menstrual period or you have blood clots.  You have an oral temperature above 102 F (38.9 C), not controlled by medicine.  You have increasing cramps or pains not relieved with medicine.  You develop belly (abdominal) pain that does not seem to be related to the same area of earlier cramping and pain.  You pass out.  You develop pain in the tops of your shoulders (shoulder strap areas).  You develop shortness of breath. MAKE SURE YOU:   Understand these instructions.  Will watch your condition.  Will get help right away if you are not doing well or get worse. Document Released: 07/09/2000 Document Revised: 06/25/2011 Document Reviewed: 11/01/2008 [facility] Patient Information 2014 [place], [place]. Dilation and Curettage or Vacuum Curettage Dilation and curettage (D&C) and vacuum curettage are minor procedures. A D&C involves stretching (dilation) the cervix and scraping (curettage) the inside lining of the womb (uterus). During a D&C, tissue is gently scraped from the inside lining of the uterus. During a vacuum curettage, the lining and tissue in the uterus are removed with the use of gentle suction. Curettage may be performed for diagnostic or therapeutic purposes. As a diagnostic procedure, curettage is performed for the purpose of examining tissues from the uterus. Tissue examination may help determine causes or treatment options for symptoms. A diagnostic curettage may be performed for the following symptoms:  Irregular bleeding in the uterus.  Bleeding with the development of clots.  Spotting between menstrual periods.  Prolonged menstrual  periods.  Bleeding after menopause.  No menstrual period (amenorrhea).  A change in size and shape of the uterus. A therapeutic curettage is performed to remove tissue, blood, or a contraceptive device. Therapeutic curettage may be performed for the following conditions:   Removal of an IUD (intrauterine device).  Removal of retained placenta after giving birth. Retained placenta can cause bleeding severe enough to  require transfusions or an infection.  Abortion.  Miscarriage.  Removal of polyps inside the uterus.  Removal of uncommon types of fibroids (noncancerous lumps). LET YOUR CAREGIVER KNOW ABOUT:   Allergies to food or medicine.  Medicines taken, including vitamins, herbs, eyedrops, over-the-counter medicines, and creams.  Use of steroids (by mouth or creams).  Previous problems with anesthetics or numbing medicines.  History of bleeding problems or blood clots.  Previous surgery.  Other health problems, including diabetes and kidney problems.  Possibility of pregnancy, if this applies. RISKS AND COMPLICATIONS   Excessive bleeding.  Infection of the uterus.  Damage to the cervix.  Development of scar tissue (adhesions) inside the uterus, later causing abnormal amounts of menstrual bleeding.  Complications from the general anesthetic, if a general anesthetic is used.  Putting a hole (perforation) in the uterus. This is rare. BEFORE THE PROCEDURE   Eat and drink before the procedure only as directed by your caregiver.  Arrange for someone to take you home. PROCEDURE   This procedure may be done in a hospital, outpatient clinic, or caregiver's office.  You may be given a general anesthetic or a local anesthetic in and around the cervix.  You will lie on your back with your legs in stirrups.  There are two ways in which your cervix can be softened and dilated. These include:  Taking a medicine.  Having thin rods (laminaria) inserted into your  cervix.  A curved tool (curette) will scrape cells from the inside lining of the uterus and will then be removed. This procedure usually takes about 15 to 30 minutes. AFTER THE PROCEDURE   You will rest in the recovery area until you are stable and are ready to go home.  You will need to have someone take you home.  You may feel sick to your stomach (nauseous) or throw up (vomit) if you had general anesthesia.  You may have a sore throat if a tube was placed in your throat during general anesthesia.  You may have light cramping and bleeding for 2 days to 2 weeks after the procedure.  Your uterus needs to make a new lining after the procedure. This may make your next period late. Document Released: 04/02/2005 Document Revised: 06/25/2011 Document Reviewed: 10/29/2008 [facility] Patient Information 2014 [place], [place]. PATIENT INSTRUCTIONS POST-ANESTHESIA  IMMEDIATELY FOLLOWING SURGERY:  Do not drive or operate machinery for the first twenty four hours after surgery.  Do not make any important decisions for twenty four hours after surgery or while taking narcotic pain medications or sedatives.  If you develop intractable nausea and vomiting or a severe headache please notify your doctor immediately.  FOLLOW-UP:  Please make an appointment with your surgeon as instructed. You do not need to follow up with anesthesia unless specifically instructed to do so.  WOUND CARE INSTRUCTIONS (if applicable):  Keep a dry clean dressing on the anesthesia/puncture wound site if there is drainage.  Once the wound has quit draining you may leave it open to air.  Generally you should leave the bandage intact for twenty four hours unless there is drainage.  If the epidural site drains for more than 36-48 hours please call the anesthesia department.  QUESTIONS?:  Please feel free to call your physician or the hospital operator if you have any questions, and they will be happy to assist you.

## 2012-10-14 ENCOUNTER — Other Ambulatory Visit: Payer: Self-pay | Admitting: Obstetrics and Gynecology

## 2012-10-14 ENCOUNTER — Encounter (HOSPITAL_COMMUNITY): Payer: Self-pay

## 2012-10-14 ENCOUNTER — Encounter (HOSPITAL_COMMUNITY)
Admission: RE | Admit: 2012-10-14 | Discharge: 2012-10-14 | Disposition: A | Discharge status: Home / Self Care | Payer: BC Managed Care – PPO | Source: Ambulatory Visit | Attending: Obstetrics and Gynecology | Admitting: Obstetrics and Gynecology

## 2012-10-14 HISTORY — DX: Other specified postprocedural states: Z98.890

## 2012-10-14 HISTORY — DX: Overactive bladder: N32.81

## 2012-10-14 HISTORY — DX: Nausea with vomiting, unspecified: R11.2

## 2012-10-14 LAB — URINALYSIS, ROUTINE W REFLEX MICROSCOPIC
Bilirubin Urine: NEGATIVE
Ketones, ur: NEGATIVE mg/dL
Leukocytes, UA: NEGATIVE
Nitrite: NEGATIVE
Urobilinogen, UA: 0.2 mg/dL (ref 0.0–1.0)

## 2012-10-14 LAB — CBC
Hemoglobin: 11.7 g/dL — ABNORMAL LOW (ref 12.0–15.0)
MCH: 25.8 pg — ABNORMAL LOW (ref 26.0–34.0)
MCHC: 31.9 g/dL (ref 30.0–36.0)
Platelets: 276 10*3/uL (ref 150–400)
RBC: 4.54 MIL/uL (ref 3.87–5.11)

## 2012-10-14 LAB — BASIC METABOLIC PANEL
Calcium: 9.4 mg/dL (ref 8.4–10.5)
GFR calc Af Amer: >90 mL/min (ref >90)
GFR calc non Af Amer: >90 mL/min (ref >90)
Glucose, Bld: 84 mg/dL (ref 70–99)
Potassium: 4.2 mEq/L (ref 3.5–5.1)
Sodium: 137 mEq/L (ref 135–145)

## 2012-10-14 LAB — HCG, SERUM, QUALITATIVE: Preg, Serum: NEGATIVE

## 2012-10-20 NOTE — H&P (Signed)
  [facility] Visit   Patient name: Margaret Cruz MRN [id number] Date of birth: 07-06-1970  CC & HPI:   Margaret Cruz is a 42 y.o. female presenting today for preop  Pt here today for pre op visit. Pt states she is going to have a polyp removed and possibly a D&C. And Ablation.  ROS:   Positive for urinary urgency 5-6 times per night as well as during the day, tried on oxybutynin without good results. Patient denies stress incontinence  Periods are heavy 6-7 days with 3 days better particularly heavy requiring up to 48 pads in 24 hours  Pertinent History Reviewed:   Medical & Surgical Hx: Reviewed: Significant for good overall health  Medications: Reviewed & Updated - see associated section  Social History: Reviewed - reports that she has never smoked. She has never used smokeless tobacco. married stable relationship x 27 year  Family history a concern to the patient due to at least 2 patient family members who allegedly had cervical cancer.  Objective Findings:   Vitals: BP 120/74  Ht 5\' 5"  (1.651 m)  Wt 220 lb 9.6 oz (100.064 kg)  BMI 36.71 kg/m2  LMP 09/19/2012  Physical Examination: General appearance - alert, well appearing, and in no distress, oriented to person, place, and time and overweight  Mental status - alert, oriented to person, place, and time, normal mood, behavior, speech, dress, motor activity, and thought processes, conservative attire  Eyes - pupils equal and reactive, extraocular eye movements intact, sclera anicteric  Neck - supple, no significant adenopathy  Chest - clear to auscultation, no wheezes, rales or rhonchi, symmetric air entry  Heart - normal rate and regular rhythm  Abdomen - soft, nontender, nondistended, no masses or organomegaly  Pelvic - normal external genitalia, vulva, vagina, cervix, uterus and adnexa, UTERUS: uterus is normal size, shape, consistency and nontender, enlarged to 6-8 week's size, first-degree uterine descensus only   Extremities - peripheral pulses normal, no pedal edema, no clubbing or cyanosis, Homan's sign negative bilaterally  Ultrasound done in the office from last week has been reviewed. It shows a distinct circular area consistent with a small endometrial polyp and an otherwise normal appearing endometrium. Discussed the pros and cons of endometrial biopsy prior to procedure, and patient is in preference of doing the biopsy as part of the [facility] and polypectomy.  Assessment & Plan:   Menorrhagia with endometrial polyp  Plan:  Hysteroscopy, Dilation and Curettage, with removal of Polyp, then endometrial ablation.

## 2012-10-21 ENCOUNTER — Encounter (HOSPITAL_COMMUNITY): Payer: Self-pay | Admitting: Anesthesiology

## 2012-10-21 ENCOUNTER — Encounter (HOSPITAL_COMMUNITY)
Admission: RE | Disposition: A | Discharge status: Home / Self Care | Payer: Self-pay | Source: Ambulatory Visit | Attending: Obstetrics and Gynecology

## 2012-10-21 ENCOUNTER — Ambulatory Visit (HOSPITAL_COMMUNITY)
Admission: RE | Admit: 2012-10-21 | Discharge: 2012-10-21 | Disposition: A | Discharge status: Home / Self Care | Payer: BC Managed Care – PPO | Source: Ambulatory Visit | Attending: Obstetrics and Gynecology | Admitting: Obstetrics and Gynecology

## 2012-10-21 ENCOUNTER — Encounter (HOSPITAL_COMMUNITY): Payer: Self-pay | Admitting: *Deleted

## 2012-10-21 ENCOUNTER — Ambulatory Visit (HOSPITAL_COMMUNITY): Payer: BC Managed Care – PPO | Admitting: Anesthesiology

## 2012-10-21 DIAGNOSIS — N920 Excessive and frequent menstruation with regular cycle: Secondary | ICD-10-CM

## 2012-10-21 DIAGNOSIS — N840 Polyp of corpus uteri: Secondary | ICD-10-CM | POA: Insufficient documentation

## 2012-10-21 HISTORY — PX: POLYPECTOMY: SHX5525

## 2012-10-21 HISTORY — PX: DILITATION & CURRETTAGE/HYSTROSCOPY WITH THERMACHOICE ABLATION: SHX5569

## 2012-10-21 SURGERY — DILATATION & CURETTAGE/HYSTEROSCOPY WITH THERMACHOICE ABLATION
Anesthesia: General | Wound class: Clean Contaminated

## 2012-10-21 MED ORDER — IBUPROFEN 600 MG PO TABS: 600.0000 mg | ORAL_TABLET | Freq: Four times a day (QID) | ORAL | Status: DC | PRN

## 2012-10-21 MED ORDER — DEXAMETHASONE SODIUM PHOSPHATE 4 MG/ML IJ SOLN
INTRAMUSCULAR | Status: AC
  Filled 2012-10-21: qty 1

## 2012-10-21 MED ORDER — GLYCOPYRROLATE 0.2 MG/ML IJ SOLN
0.2000 mg | Freq: Once | INTRAMUSCULAR | Status: AC
  Administered 2012-10-21: 0.2 mg via INTRAVENOUS

## 2012-10-21 MED ORDER — LIDOCAINE HCL (CARDIAC) 20 MG/ML IV SOLN
INTRAVENOUS | Status: DC | PRN
  Administered 2012-10-21: 30 mg via INTRAVENOUS

## 2012-10-21 MED ORDER — ONDANSETRON HCL 4 MG/2ML IJ SOLN
INTRAMUSCULAR | Status: AC
  Filled 2012-10-21: qty 2

## 2012-10-21 MED ORDER — LIDOCAINE HCL (PF) 1 % IJ SOLN
INTRAMUSCULAR | Status: AC
  Filled 2012-10-21: qty 5

## 2012-10-21 MED ORDER — MIDAZOLAM HCL 2 MG/2ML IJ SOLN
INTRAMUSCULAR | Status: AC
  Filled 2012-10-21: qty 2

## 2012-10-21 MED ORDER — SCOPOLAMINE 1 MG/3DAYS TD PT72
MEDICATED_PATCH | TRANSDERMAL | Status: AC
  Filled 2012-10-21: qty 1

## 2012-10-21 MED ORDER — BUPIVACAINE-EPINEPHRINE PF 0.5-1:200000 % IJ SOLN
INTRAMUSCULAR | Status: AC
  Filled 2012-10-21: qty 10

## 2012-10-21 MED ORDER — ONDANSETRON HCL 4 MG/2ML IJ SOLN: 4.0000 mg | Freq: Once | INTRAMUSCULAR | Status: DC | PRN

## 2012-10-21 MED ORDER — MIDAZOLAM HCL 2 MG/2ML IJ SOLN
1.0000 mg | INTRAMUSCULAR | Status: DC | PRN
  Administered 2012-10-21: 2 mg via INTRAVENOUS

## 2012-10-21 MED ORDER — DEXAMETHASONE SODIUM PHOSPHATE 4 MG/ML IJ SOLN
4.0000 mg | Freq: Once | INTRAMUSCULAR | Status: AC
  Administered 2012-10-21: 4 mg via INTRAVENOUS

## 2012-10-21 MED ORDER — SODIUM CHLORIDE 0.9 % IR SOLN
Status: DC | PRN
  Administered 2012-10-21: 3000 mL

## 2012-10-21 MED ORDER — LACTATED RINGERS IV SOLN
INTRAVENOUS | Status: DC
  Administered 2012-10-21: 08:00:00 via INTRAVENOUS

## 2012-10-21 MED ORDER — GLYCOPYRROLATE 0.2 MG/ML IJ SOLN
INTRAMUSCULAR | Status: AC
  Filled 2012-10-21: qty 1

## 2012-10-21 MED ORDER — FENTANYL CITRATE 0.05 MG/ML IJ SOLN
INTRAMUSCULAR | Status: DC | PRN
  Administered 2012-10-21: 50 ug via INTRAVENOUS
  Administered 2012-10-21 (×2): 25 ug via INTRAVENOUS

## 2012-10-21 MED ORDER — FENTANYL CITRATE 0.05 MG/ML IJ SOLN: 25.0000 ug | INTRAMUSCULAR | Status: DC | PRN

## 2012-10-21 MED ORDER — FENTANYL CITRATE 0.05 MG/ML IJ SOLN
INTRAMUSCULAR | Status: AC
  Filled 2012-10-21: qty 5

## 2012-10-21 MED ORDER — SCOPOLAMINE 1 MG/3DAYS TD PT72
1.0000 | MEDICATED_PATCH | Freq: Once | TRANSDERMAL | Status: DC
  Administered 2012-10-21: 1.5 mg via TRANSDERMAL

## 2012-10-21 MED ORDER — CEFAZOLIN SODIUM-DEXTROSE 2-3 GM-% IV SOLR
INTRAVENOUS | Status: AC
  Filled 2012-10-21: qty 50

## 2012-10-21 MED ORDER — CEFAZOLIN SODIUM-DEXTROSE 2-3 GM-% IV SOLR
2.0000 g | INTRAVENOUS | Status: AC
  Administered 2012-10-21: 2 g via INTRAVENOUS

## 2012-10-21 MED ORDER — OXYCODONE-ACETAMINOPHEN 5-325 MG PO TABS: 1.0000 | ORAL_TABLET | ORAL | Status: DC | PRN

## 2012-10-21 MED ORDER — BUPIVACAINE-EPINEPHRINE 0.5% -1:200000 IJ SOLN
INTRAMUSCULAR | Status: DC | PRN
  Administered 2012-10-21: 20 mL

## 2012-10-21 MED ORDER — SODIUM CHLORIDE 0.9 % IR SOLN
Status: DC | PRN
  Administered 2012-10-21: 1000 mL

## 2012-10-21 MED ORDER — PROPOFOL 10 MG/ML IV EMUL
INTRAVENOUS | Status: AC
  Filled 2012-10-21: qty 40

## 2012-10-21 MED ORDER — ONDANSETRON HCL 4 MG/2ML IJ SOLN
4.0000 mg | Freq: Once | INTRAMUSCULAR | Status: AC
  Administered 2012-10-21: 4 mg via INTRAVENOUS

## 2012-10-21 MED ORDER — KETOROLAC TROMETHAMINE 30 MG/ML IJ SOLN
30.0000 mg | Freq: Once | INTRAMUSCULAR | Status: AC
  Administered 2012-10-21: 30 mg via INTRAVENOUS

## 2012-10-21 MED ORDER — DEXTROSE 5 % IV SOLN
INTRAVENOUS | Status: DC | PRN
  Administered 2012-10-21: 17 mL via INTRAVENOUS

## 2012-10-21 MED ORDER — PROPOFOL 10 MG/ML IV BOLUS
INTRAVENOUS | Status: DC | PRN
  Administered 2012-10-21: 150 mg via INTRAVENOUS
  Administered 2012-10-21: 50 mg via INTRAVENOUS
  Administered 2012-10-21: 100 mg via INTRAVENOUS

## 2012-10-21 MED ORDER — KETOROLAC TROMETHAMINE 30 MG/ML IJ SOLN
INTRAMUSCULAR | Status: AC
  Filled 2012-10-21: qty 1

## 2012-10-21 SURGICAL SUPPLY — 31 items
BAG DECANTER FOR FLEXI CONT (MISCELLANEOUS) ×2 IMPLANT
BAG HAMPER (MISCELLANEOUS) ×2 IMPLANT
CATH THERMACHOICE III (CATHETERS) ×2 IMPLANT
CLOTH BEACON ORANGE TIMEOUT ST (SAFETY) ×2 IMPLANT
COVER LIGHT HANDLE STERIS (MISCELLANEOUS) ×4 IMPLANT
COVER MAYO STAND XLG (DRAPE) ×2 IMPLANT
DECANTER SPIKE VIAL GLASS SM (MISCELLANEOUS) ×2 IMPLANT
ELECT REM PT RETURN 9FT ADLT (ELECTROSURGICAL) ×2
ELECTRODE REM PT RTRN 9FT ADLT (ELECTROSURGICAL) ×1 IMPLANT
FORMALIN 10 PREFIL 120ML (MISCELLANEOUS) ×2 IMPLANT
GLOVE BIOGEL PI IND STRL 7.0 (GLOVE) ×1 IMPLANT
GLOVE BIOGEL PI INDICATOR 7.0 (GLOVE) ×1
GLOVE ECLIPSE 9.0 STRL (GLOVE) ×2 IMPLANT
GLOVE INDICATOR STER SZ 9 (GLOVE) ×2 IMPLANT
GLOVE SS BIOGEL STRL SZ 6.5 (GLOVE) ×1 IMPLANT
GLOVE SUPERSENSE BIOGEL SZ 6.5 (GLOVE) ×1
GOWN STRL REIN 3XL LVL4 (GOWN DISPOSABLE) ×2 IMPLANT
GOWN STRL REIN XL XLG (GOWN DISPOSABLE) ×2 IMPLANT
INST SET HYSTEROSCOPY (KITS) ×2 IMPLANT
IV D5W 500ML (IV SOLUTION) ×2 IMPLANT
IV NS IRRIG 3000ML ARTHROMATIC (IV SOLUTION) ×2 IMPLANT
KIT ROOM TURNOVER AP CYSTO (KITS) ×2 IMPLANT
KIT ROOM TURNOVER APOR (KITS) ×2 IMPLANT
MANIFOLD NEPTUNE II (INSTRUMENTS) ×2 IMPLANT
NS IRRIG 1000ML POUR BTL (IV SOLUTION) ×2 IMPLANT
PACK PERI GYN (CUSTOM PROCEDURE TRAY) ×2 IMPLANT
PAD ARMBOARD 7.5X6 YLW CONV (MISCELLANEOUS) ×2 IMPLANT
PAD TELFA 3X4 1S STER (GAUZE/BANDAGES/DRESSINGS) ×2 IMPLANT
SET BASIN LINEN APH (SET/KITS/TRAYS/PACK) ×2 IMPLANT
SET IRRIG Y TYPE TUR BLADDER L (SET/KITS/TRAYS/PACK) ×2 IMPLANT
SYR CONTROL 10ML LL (SYRINGE) ×2 IMPLANT

## 2012-10-21 NOTE — Anesthesia Procedure Notes (Signed)
Procedure Name: LMA Insertion Date/Time: 10/21/2012 9:02 AM Performed by: Glynn Octave E Pre-anesthesia Checklist: Patient identified, Patient being monitored, Emergency Drugs available, Timeout performed and Suction available Patient Re-evaluated:Patient Re-evaluated prior to inductionOxygen Delivery Method: Circle System Utilized Preoxygenation: Pre-oxygenation with 100% oxygen Intubation Type: IV induction Ventilation: Mask ventilation without difficulty LMA: LMA inserted LMA Size: 4.0 Number of attempts: 1 Placement Confirmation: positive ETCO2 and breath sounds checked- equal and bilateral

## 2012-10-21 NOTE — Anesthesia Postprocedure Evaluation (Signed)
  Anesthesia Post-op Note  Patient: Margaret Cruz  Procedure(s) Performed: Procedure(s): HYSTEROSCOPY, DILATATION & CURETTAGE (no tissue for pathology), THERMACHOICE ABLATION (Total Therapy Time=25min23sec) (N/A) REMOVAL OF ENDOMETRIAL POLYP (N/A)  Patient Location: PACU  Anesthesia Type:General  Level of Consciousness: awake, alert  and oriented  Airway and Oxygen Therapy: Patient Spontanous Breathing and Patient connected to face mask oxygen  Post-op Pain: none  Post-op Assessment: Post-op Vital signs reviewed, Patient's Cardiovascular Status Stable, Respiratory Function Stable, Patent Airway and No signs of Nausea or vomiting  Post-op Vital Signs: Reviewed  Complications: No apparent anesthesia complications

## 2012-10-21 NOTE — Brief Op Note (Signed)
10/21/2012  10:10 AM  PATIENT:  Margaret Cruz  42 y.o. female  PRE-OPERATIVE DIAGNOSIS:  menometrorrhagia endometrial polyp  POST-OPERATIVE DIAGNOSIS:  menometrorrhagia endometrial polyp  PROCEDURE:  Procedure(s): HYSTEROSCOPY, DILATATION & CURETTAGE (no tissue for pathology), THERMACHOICE ABLATION (Total Therapy Time=82min23sec) (N/A) REMOVAL OF ENDOMETRIAL POLYP (N/A)  SURGEON:  Surgeon(s) and Role:    * Tilda Burrow, MD - Primary  PHYSICIAN ASSISTANT:   ASSISTANTS: Laurena Bering CST   ANESTHESIA:   general, paracervical block Marcaine x20 cc  EBL:  Total I/O In: 600 [I.V.:600] Out: 0   BLOOD ADMINISTERED:none  DRAINS: none   LOCAL MEDICATIONS USED:  MARCAINE    and Amount: 20 ml  SPECIMEN:  Source of Specimen:  No endometrial curettings obtained, endometrial polyps sent  DISPOSITION OF SPECIMEN:  PATHOLOGY  COUNTS:  YES  TOURNIQUET:  * No tourniquets in log *  DICTATION: .Dragon Dictation  PLAN OF CARE: Discharge to home after PACU  PATIENT DISPOSITION:  PACU - hemodynamically stable.   Delay start of Pharmacological VTE agent (>24hrs) due to surgical blood loss or risk of bleeding: not applicable

## 2012-10-21 NOTE — Interval H&P Note (Signed)
History and Physical Interval Note:  10/21/2012 8:49 AM  Margaret Cruz  has presented today for surgery, with the diagnosis of menometrorrhagia endometrial polyp  The various methods of treatment have been discussed with the patient and family. After consideration of risks, benefits and other options for treatment, the patient has consented to  Procedure(s): HYSTEROSCOPY, DILATATION & CURETTAGE, THERMACHOICE ABLATION (N/A) REMOVAL OF ENDOMETRIAL POLYP (N/A) as a surgical intervention .  The patient's history has been reviewed, patient examined, no change in status, stable for surgery.  I have reviewed the patient's chart and labs.  Questions were answered to the patient's satisfaction.     Tilda Burrow

## 2012-10-21 NOTE — Interval H&P Note (Signed)
History and Physical Interval Note:  10/21/2012 8:48 AM  Margaret Cruz  has presented today for surgery, with the diagnosis of menometrorrhagia endometrial polyp  The various methods of treatment have been discussed with the patient and family. After consideration of risks, benefits and other options for treatment, the patient has consented to  Procedure(s): HYSTEROSCOPY, DILATATION & CURETTAGE, THERMACHOICE ABLATION (N/A) REMOVAL OF ENDOMETRIAL POLYP (N/A) as a surgical intervention .  The patient's history has been reviewed, patient examined, no change in status, stable for surgery.  I have reviewed the patient's chart and labs.  Questions were answered to the patient's satisfaction.  She began her period 3 days ago, is now lightening up.    Tilda Burrow

## 2012-10-21 NOTE — Transfer of Care (Signed)
Immediate Anesthesia Transfer of Care Note  Patient: Margaret Cruz  Procedure(s) Performed: Procedure(s): HYSTEROSCOPY, DILATATION & CURETTAGE (no tissue for pathology), THERMACHOICE ABLATION (Total Therapy Time=30min23sec) (N/A) REMOVAL OF ENDOMETRIAL POLYP (N/A)  Patient Location: PACU  Anesthesia Type:General  Level of Consciousness: awake, alert  and oriented  Airway & Oxygen Therapy: Patient Spontanous Breathing and Patient connected to face mask oxygen  Post-op Assessment: Report given to PACU RN  Post vital signs: Reviewed and stable  Complications: No apparent anesthesia complications

## 2012-10-21 NOTE — Op Note (Signed)
PATIENT:  Margaret Cruz  42 y.o. female  PRE-OPERATIVE DIAGNOSIS:  menometrorrhagia endometrial polyp  POST-OPERATIVE DIAGNOSIS:  menometrorrhagia endometrial polyp  PROCEDURE:  Procedure(s): HYSTEROSCOPY, DILATATION & CURETTAGE (no tissue for pathology), THERMACHOICE ABLATION (Total Therapy Time=42min23sec) (N/A) REMOVAL OF ENDOMETRIAL POLYP (N/A)  SURGEON:  Surgeon(s) and Role:    * Tilda Burrow, MD - Primary Details of procedure: Patient was taken to the operating room prepped and draped for vaginal procedure, cervix grasped with single-tooth tenaculum and uterus sounded in the anteflexed position to 11 cm, after timeout per operative team. Uterus was dilated to 23 [place] allowing introduction of a 30 rigid operative hysteroscope which identified the endometrial polyp, then removed it with the operative scissors and the endometrial cavity was otherwise unremarkable with both tubal ostia visualized. Gynecare ThermaChoice 3 endometrial ablation device was then inserted and the 8 minute thermal ablation sequence completed with 17 cc of D5W utilized and all 17 cc recovered. Patient had paracervical block using 20 cc of Marcaine with epinephrine, and completed the procedure without complication and patient went to recovery in stable condition sponge and needle counts correct

## 2012-10-21 NOTE — Anesthesia Preprocedure Evaluation (Signed)
Anesthesia Evaluation  Patient identified by MRN, date of birth, ID band Patient awake    Reviewed: Allergy & Precautions, H&P , NPO status , Patient's Chart, lab work & pertinent test results  History of Anesthesia Complications (+) PONV  Airway Mallampati: II TM Distance: >3 FB     Dental  (+) Teeth Intact   Pulmonary neg pulmonary ROS,  breath sounds clear to auscultation        Cardiovascular hypertension, Pt. on medications Rhythm:Regular Rate:Normal     Neuro/Psych  Headaches,    GI/Hepatic   Endo/Other    Renal/GU      Musculoskeletal   Abdominal   Peds  Hematology   Anesthesia Other Findings   Reproductive/Obstetrics                           Anesthesia Physical Anesthesia Plan  ASA: II  Anesthesia Plan: General   Post-op Pain Management:    Induction: Intravenous  Airway Management Planned: LMA  Additional Equipment:   Intra-op Plan:   Post-operative Plan: Extubation in OR  Informed Consent: I have reviewed the patients History and Physical, chart, labs and discussed the procedure including the risks, benefits and alternatives for the proposed anesthesia with the patient or authorized representative who has indicated his/her understanding and acceptance.     Plan Discussed with:   Anesthesia Plan Comments:         Anesthesia Quick Evaluation

## 2012-10-23 ENCOUNTER — Encounter (HOSPITAL_COMMUNITY): Payer: Self-pay | Admitting: Obstetrics and Gynecology

## 2012-10-30 ENCOUNTER — Other Ambulatory Visit: Payer: Self-pay

## 2012-10-30 MED ORDER — PRAMIPEXOLE DIHYDROCHLORIDE 0.5 MG PO TABS: 0.5000 mg | ORAL_TABLET | Freq: Every day | ORAL | Status: DC

## 2012-10-30 NOTE — Telephone Encounter (Signed)
Last seen 02/06/12  Last filled 08/12/12  Told she needed to be seen before next refill last time  MMM

## 2012-10-31 ENCOUNTER — Telehealth: Payer: Self-pay | Admitting: Nurse Practitioner

## 2012-10-31 ENCOUNTER — Encounter: Payer: BC Managed Care – PPO | Admitting: Obstetrics and Gynecology

## 2012-10-31 NOTE — Telephone Encounter (Signed)
Med filled last pm- pt aware

## 2012-11-03 ENCOUNTER — Ambulatory Visit (INDEPENDENT_AMBULATORY_CARE_PROVIDER_SITE_OTHER): Payer: BC Managed Care – PPO | Admitting: Obstetrics and Gynecology

## 2012-11-03 ENCOUNTER — Encounter: Payer: Self-pay | Admitting: Obstetrics and Gynecology

## 2012-11-03 VITALS — BP 124/74 | Temp 98.4°F | Ht 65.0 in | Wt 218.6 lb

## 2012-11-03 DIAGNOSIS — L738 Other specified follicular disorders: Secondary | ICD-10-CM

## 2012-11-03 DIAGNOSIS — L739 Follicular disorder, unspecified: Secondary | ICD-10-CM

## 2012-11-03 DIAGNOSIS — L678 Other hair color and hair shaft abnormalities: Secondary | ICD-10-CM

## 2012-11-03 MED ORDER — SULFAMETHOXAZOLE-TMP DS 800-160 MG PO TABS: 1.0000 | ORAL_TABLET | Freq: Two times a day (BID) | ORAL | Status: DC

## 2012-11-03 NOTE — Progress Notes (Signed)
Patient ID: Margaret Cruz, female   DOB: January 06, 1971, 42 y.o.   MRN: [id number] Pt here today for post op appointment for a Dilatation and Curettage with Thermachoice Ablation. Pt states notice a vaginal "cyst" on Thursday, started having "chills and flu like symptoms on Saturday," uncomfortable to sit. Pt states has noticed some draining from cyst on Saturday.  Subjective:     Lynita Lombard Joslyn Diet:       regular without difficulty. Bowel function is: normal. Pain:     The patient is not having any pain.    Review of Systems Pertinent items are noted in HPI.    Objective:    BP 124/74  Temp(Src) 98.4 F (36.9 C) (Oral)  Ht 5\' 5"  (1.651 m)  Wt 218 lb 9.6 oz (99.156 kg)  BMI 36.38 kg/m2  LMP 10/18/2012 General:  alert and mild distress  Abdomen:   Incision:          Pelvic: Archie Patten has a large cellulitis around a draining folliculitis.marked with skin marker, cultured Physical Examination: Pelvic - UTERUS: uterus is normal size, shape, consistency and nontender, lie d/c , normal postop, ADNEXA: normal adnexa in size, nontender and no masses,       Assessment:    Postoperative course complicated by unrelated folliculitis Operative findings again reviewed. Pathology report discussed.    Plan:    1. Continue any current medications. 2. Wound care discussed. 3. Activity restrictions: no sex x 2 wk 4. Anticipated return to work: not applicable. 5. Follow up: prn  for  Probs. Rx Septra DS x 10 d.

## 2012-11-03 NOTE — Patient Instructions (Signed)
Folliculitis  Folliculitis is redness, soreness, and swelling (inflammation) of the hair follicles. This condition can occur anywhere on the body. People with weakened immune systems, diabetes, or obesity have a greater risk of getting folliculitis. CAUSES  Bacterial infection. This is the most common cause.  Fungal infection.  Viral infection.  Contact with certain chemicals, especially oils and tars. Long-term folliculitis can result from bacteria that live in the nostrils. The bacteria may trigger multiple outbreaks of folliculitis over time. SYMPTOMS Folliculitis most commonly occurs on the scalp, thighs, legs, back, buttocks, and areas where hair is shaved frequently. An early sign of folliculitis is a small, white or yellow, pus-filled, itchy lesion (pustule). These lesions appear on a red, inflamed follicle. They are usually less than 0.2 inches (5 mm) wide. When there is an infection of the follicle that goes deeper, it becomes a boil or furuncle. A group of closely packed boils creates a larger lesion (carbuncle). Carbuncles tend to occur in hairy, sweaty areas of the body. DIAGNOSIS  Your caregiver can usually tell what is wrong by doing a physical exam. A sample may be taken from one of the lesions and tested in a lab. This can help determine what is causing your folliculitis. TREATMENT  Treatment may include:  Applying warm compresses to the affected areas.  Taking antibiotic medicines orally or applying them to the skin.  Draining the lesions if they contain a large amount of pus or fluid.  Laser hair removal for cases of long-lasting folliculitis. This helps to prevent regrowth of the hair. HOME CARE INSTRUCTIONS  Apply warm compresses to the affected areas as directed by your caregiver.  If antibiotics are prescribed, take them as directed. Finish them even if you start to feel better.  You may take over-the-counter medicines to relieve itching.  Do not shave  irritated skin.  Follow up with your caregiver as directed. SEEK IMMEDIATE MEDICAL CARE IF:   You have increasing redness, swelling, or pain in the affected area.  You have a fever. MAKE SURE YOU:  Understand these instructions.  Will watch your condition.  Will get help right away if you are not doing well or get worse. Document Released: 06/11/2001 Document Revised: 10/02/2011 Document Reviewed: 07/03/2011 ExitCare Patient Information 2014 [employer].  

## 2012-11-05 ENCOUNTER — Telehealth: Payer: Self-pay | Admitting: *Deleted

## 2012-11-05 NOTE — Telephone Encounter (Signed)
Spoke with pt. Had appt Monday with Dr. Emelda Fear and was checked for MRSA. Was calling to get results. Results not back yet. Advised to call back Friday if she hasn't heard from [place]. Pt voiced understanding JSY

## 2012-11-06 ENCOUNTER — Encounter: Payer: Self-pay | Admitting: *Deleted

## 2012-11-06 NOTE — Progress Notes (Signed)
Patient ID: Margaret Cruz, female   DOB: 01-18-1971, 42 y.o.   MRN: [id number] Pt had a MRSA culture done on 11/03/12. The culture was negative for type 1,2,3, and 4.

## 2012-11-07 ENCOUNTER — Telehealth: Payer: Self-pay | Admitting: Adult Health

## 2012-11-07 NOTE — Telephone Encounter (Signed)
Spoke with pt. MRSA culture was negative for types 1,2,3,4. JSY

## 2012-11-19 ENCOUNTER — Telehealth: Payer: Self-pay | Admitting: Nurse Practitioner

## 2012-11-19 NOTE — Telephone Encounter (Signed)
Bilateral lower extremity swelling x 3 days. Patient stands on her feet all day. Symptoms improve at night when she is able to sit down.   She started a new medication approximately 1 month ago for urinary incontinence.   Appt scheduled for tomorrow morning with Paulene Floor, FNP.  Asked patient to elevate her feet tonight and to decrease sodium intake.   Denies any SOB. History is negative for kidney or heart disease. Patient agreed to plan.

## 2012-11-20 ENCOUNTER — Encounter: Payer: Self-pay | Admitting: Nurse Practitioner

## 2012-11-20 ENCOUNTER — Ambulatory Visit (INDEPENDENT_AMBULATORY_CARE_PROVIDER_SITE_OTHER): Payer: BC Managed Care – PPO | Admitting: Nurse Practitioner

## 2012-11-20 VITALS — BP 123/77 | HR 77 | Temp 96.6°F | Wt 224.0 lb

## 2012-11-20 DIAGNOSIS — R609 Edema, unspecified: Secondary | ICD-10-CM

## 2012-11-20 DIAGNOSIS — G2581 Restless legs syndrome: Secondary | ICD-10-CM

## 2012-11-20 MED ORDER — PRAMIPEXOLE DIHYDROCHLORIDE 0.5 MG PO TABS: 0.5000 mg | ORAL_TABLET | Freq: Every day | ORAL | Status: DC

## 2012-11-20 MED ORDER — FUROSEMIDE 40 MG PO TABS: 40.0000 mg | ORAL_TABLET | Freq: Every day | ORAL | Status: DC

## 2012-11-20 NOTE — Progress Notes (Addendum)
  Subjective:    Patient ID: Margaret Cruz, female    DOB: May 19, 1970, 42 y.o.   MRN: [id number]  HPI Pt presents to office with edema in bilaterally legs. Pt has had a hx of edema for years, but the last 4 days the swelling is worse. Pt states it use to be only ankles, but now it goes into her feet and up to her knee. Pt states by the end of the day pt cannot feel toes because of the swelling. Pt had used compression hose in the past but states she felt that it made it worse. Pt also states she also use to use Lasix about a year ago, but swelling resolved so she stopped the lasix.     Review of Systems  Constitutional: Positive for fatigue.  Cardiovascular: Positive for leg swelling.  All other systems reviewed and are negative.       Objective:   Physical Exam  Constitutional: She is oriented to person, place, and time.  Neck: Normal range of motion. Neck supple. No thyromegaly present.  Cardiovascular: Normal rate, regular rhythm, normal heart sounds and intact distal pulses.   Pulmonary/Chest: Effort normal and breath sounds normal.  Abdominal: Soft. Bowel sounds are normal. She exhibits no distension. There is no tenderness.  Musculoskeletal: Normal range of motion. She exhibits edema (Mild amt of edema in bilaterally legs). She exhibits no tenderness.  Neurological: She is alert and oriented to person, place, and time.  Skin: Skin is warm and dry.  First toe nail beds are thick and white  Psychiatric: She has a normal mood and affect. Her behavior is normal. Judgment and thought content normal.     BP 123/77  Pulse 77  Temp(Src) 96.6 F (35.9 C) (Oral)  Wt 224 lb (101.606 kg)  BMI 37.28 kg/m2      Assessment & Plan:  1. Peripheral edema Elevate legs when sitting May need to get compression hose to wear - furosemide (LASIX) 40 MG tablet; Take 1 tablet (40 mg total) by mouth daily.  Dispense: 30 tablet; Refill: 3 - CMP14+EGFR - NMR, lipoprofile  2. RLS (restless  legs syndrome) Keeps leg swarm at night - pramipexole (MIRAPEX) 0.5 MG tablet; Take 1 tablet (0.5 mg total) by mouth daily.  Dispense: 30 tablet; Refill: 5  3. Onychmycosis Will call in meds after get labs result  [name], FNP

## 2012-11-20 NOTE — Patient Instructions (Signed)

## 2012-11-22 LAB — CMP14+EGFR
Albumin/Globulin Ratio: 1.7 (ref 1.1–2.5)
Albumin: 4 g/dL (ref 3.5–5.5)
BUN/Creatinine Ratio: 9 (ref 9–23)
BUN: 6 mg/dL (ref 6–24)
Creatinine, Ser: 0.68 mg/dL (ref 0.57–1.00)
GFR calc Af Amer: 126 mL/min/{1.73_m2} (ref >59)
GFR calc non Af Amer: 109 mL/min/{1.73_m2} (ref >59)
Globulin, Total: 2.3 g/dL (ref 1.5–4.5)
Glucose: 79 mg/dL (ref 65–99)
Total Bilirubin: 0.4 mg/dL (ref 0.0–1.2)
Total Protein: 6.3 g/dL (ref 6.0–8.5)

## 2012-11-22 LAB — NMR, LIPOPROFILE
Cholesterol: 180 mg/dL (ref <200)
HDL Particle Number: 44.5 umol/L (ref >=30.5)
LDL Particle Number: 1390 nmol/L — ABNORMAL HIGH (ref <1000)
LDL Size: 21.1 nm (ref >20.5)
Small LDL Particle Number: 120 nmol/L (ref <=527)
Triglycerides by NMR: 105 mg/dL (ref <150)

## 2012-11-24 ENCOUNTER — Other Ambulatory Visit: Payer: Self-pay | Admitting: *Deleted

## 2012-11-28 ENCOUNTER — Telehealth: Payer: Self-pay | Admitting: Nurse Practitioner

## 2012-11-28 ENCOUNTER — Other Ambulatory Visit: Payer: Self-pay | Admitting: Nurse Practitioner

## 2012-11-28 ENCOUNTER — Other Ambulatory Visit: Payer: Self-pay

## 2012-11-28 MED ORDER — ITRACONAZOLE 100 MG PO CAPS: 100.0000 mg | ORAL_CAPSULE | Freq: Two times a day (BID) | ORAL | Status: DC

## 2012-11-28 NOTE — Telephone Encounter (Signed)
rx sent to pharmacy

## 2012-11-28 NOTE — Telephone Encounter (Signed)
Last seen 11/20/12  MMM  Sporanox not on EPIC med list  Sent from [facility]

## 2012-11-28 NOTE — Telephone Encounter (Signed)
Find out what dose she has been on

## 2012-11-28 NOTE — Telephone Encounter (Signed)
Message is being taken care of in RX encounter

## 2012-11-28 NOTE — Telephone Encounter (Signed)
Patient said she has been on 100mg  BID

## 2012-12-23 ENCOUNTER — Telehealth: Payer: Self-pay | Admitting: Family Medicine

## 2012-12-23 ENCOUNTER — Ambulatory Visit (INDEPENDENT_AMBULATORY_CARE_PROVIDER_SITE_OTHER): Payer: BC Managed Care – PPO | Admitting: Family Medicine

## 2012-12-23 ENCOUNTER — Encounter: Payer: Self-pay | Admitting: Family Medicine

## 2012-12-23 VITALS — BP 122/73 | HR 69 | Temp 97.2°F | Ht 65.0 in | Wt 229.0 lb

## 2012-12-23 DIAGNOSIS — J069 Acute upper respiratory infection, unspecified: Secondary | ICD-10-CM

## 2012-12-23 DIAGNOSIS — J40 Bronchitis, not specified as acute or chronic: Secondary | ICD-10-CM

## 2012-12-23 DIAGNOSIS — R062 Wheezing: Secondary | ICD-10-CM

## 2012-12-23 MED ORDER — AZITHROMYCIN 250 MG PO TABS: ORAL_TABLET | ORAL | Status: DC

## 2012-12-23 MED ORDER — METHYLPREDNISOLONE ACETATE 40 MG/ML IJ SUSP: 80.0000 mg | Freq: Once | INTRAMUSCULAR | Status: DC

## 2012-12-23 MED ORDER — BENZONATATE 100 MG PO CAPS: 100.0000 mg | ORAL_CAPSULE | Freq: Three times a day (TID) | ORAL | Status: DC | PRN

## 2012-12-23 MED ORDER — METHYLPREDNISOLONE ACETATE 80 MG/ML IJ SUSP
80.0000 mg | Freq: Once | INTRAMUSCULAR | Status: AC
  Administered 2012-12-23: 80 mg via INTRAMUSCULAR

## 2012-12-23 NOTE — Patient Instructions (Signed)
Methylprednisolone Suspension for Injection What is this medicine? METHYLPREDNISOLONE (meth ill pred NISS oh lone) is a corticosteroid. It is commonly used to treat inflammation of the skin, joints, lungs, and other organs. Common conditions treated include asthma, allergies, and arthritis. It is also used for other conditions, such as blood disorders and diseases of the adrenal glands. This medicine may be used for other purposes; ask your health care provider or pharmacist if you have questions. What should I tell my health care provider before I take this medicine? They need to know if you have any of these conditions: -cataracts or glaucoma -Cushings -heart disease -high blood pressure -infection including tuberculosis -low calcium or potassium levels in the blood -recent surgery -seizures -stomach or intestinal disease, including colitis -threadworms -thyroid problems -an unusual or allergic reaction to methylprednisolone, corticosteroids, benzyl alcohol, other medicines, foods, dyes, or preservatives -pregnant or trying to get pregnant -breast-feeding How should I use this medicine? This medicine is for injection into a muscle, joint, or other tissue. It is given by a health care professional in a hospital or clinic setting. Talk to your pediatrician regarding the use of this medicine in children. While this drug may be prescribed for selected conditions, precautions do apply. Overdosage: If you think you have taken too much of this medicine contact a poison control center or emergency room at once. NOTE: This medicine is only for you. Do not share this medicine with others. What if I miss a dose? This does not apply. What may interact with this medicine? Do not take this medicine with any of the following medications: -mifepristone -radiopaque contrast agents This medicine may also interact with the following medications: -aspirin and aspirin-like  medicines -cyclosporin -ketoconazole -phenobarbital -phenytoin -rifampin -tacrolimus -troleandomycin -vaccines -warfarin This list may not describe all possible interactions. Give your health care provider a list of all the medicines, herbs, non-prescription drugs, or dietary supplements you use. Also tell them if you smoke, drink alcohol, or use illegal drugs. Some items may interact with your medicine. What should I watch for while using this medicine? Visit your doctor or health care professional for regular checks on your progress. If you are taking this medicine for a long time, carry an identification card with your name and address, the type and dose of your medicine, and your doctor's name and address. The medicine may increase your risk of getting an infection. Stay away from people who are sick. Tell your doctor or health care professional if you are around anyone with measles or chickenpox. You may need to avoid some vaccines. Talk to your health care provider for more information. If you are going to have surgery, tell your doctor or health care professional that you have taken this medicine within the last twelve months. Ask your doctor or health care professional about your diet. You may need to lower the amount of salt you eat. The medicine can increase your blood sugar. If you are a diabetic check with your doctor if you need help adjusting the dose of your diabetic medicine. What side effects may I notice from receiving this medicine? Side effects that you should report to your doctor or health care professional as soon as possible: -allergic reactions like skin rash, itching or hives, swelling of the face, lips, or tongue -bloody or tarry stools -changes in vision -eye pain or bulging eyes -fever, sore throat, sneezing, cough, or other signs of infection, wounds that will not heal -increased thirst -irregular heartbeat -muscle cramps -pain   in hips, back, ribs, arms,  shoulders, or legs -swelling of the ankles, feet, hands -trouble passing urine or change in the amount of urine -unusual bleeding or bruising -unusually weak or tired -weight gain or weight loss Side effects that usually do not require medical attention (report to your doctor or health care professional if they continue or are bothersome): -changes in emotions or moods -constipation or diarrhea -headache -irritation at site where injected -nausea, vomiting -skin problems, acne, thin and shiny skin -trouble sleeping -unusual hair growth on the face or body This list may not describe all possible side effects. Call your doctor for medical advice about side effects. You may report side effects to FDA at 1-800-FDA-1088. Where should I keep my medicine? This drug is given in a hospital or clinic and will not be stored at home. NOTE: This sheet is a summary. It may not cover all possible information. If you have questions about this medicine, talk to your doctor, pharmacist, or health care provider.  2013, [employer]vier/Gold Standard. (10/22/2007 2:36:31 PM)  

## 2012-12-23 NOTE — Progress Notes (Signed)
  Subjective:    Patient ID: Margaret Cruz, female    DOB: 03/04/1971, 42 y.o.   MRN: [id number]  HPI URI Symptoms Onset: 3 weeks Description: rhinorrhea, nasal congestion, cough, some wheezing  Modifying factors:  none  Symptoms Nasal discharge: yes Fever: no Sore throat: no Cough: yes Wheezing: mild Ear pain: yes GI symptoms: no Sick contacts: no  Red Flags  Stiff neck: no Dyspnea: faint, intermittent Rash: no Swallowing difficulty: no  Sinusitis Risk Factors Headache/face pain: no Double sickening: no tooth pain: no  Allergy Risk Factors Sneezing: no Itchy scratchy throat: no Seasonal symptoms: no  Flu Risk Factors Headache: no muscle aches: no severe fatigue: no     Review of Systems  All other systems reviewed and are negative.       Objective:   Physical Exam  Constitutional: She appears well-developed and well-nourished.  HENT:  Head: Normocephalic and atraumatic.  Right Ear: External ear normal.  Left Ear: External ear normal.  +nasal erythema, rhinorrhea bilaterally, + post oropharyngeal erythema    Eyes: Conjunctivae are normal. Pupils are equal, round, and reactive to light.  Neck: Normal range of motion. Neck supple.  Cardiovascular: Normal rate, regular rhythm and normal heart sounds.   Pulmonary/Chest: Effort normal.  Faint wheezes in bases    Abdominal: Soft. Bowel sounds are normal.  Musculoskeletal: Normal range of motion.  Neurological: She is alert.  Skin: Skin is warm.          Assessment & Plan:  URI (upper respiratory infection) - Plan: methylPREDNISolone acetate (DEPO-MEDROL) injection 80 mg, azithromycin (ZITHROMAX) 250 MG tablet, benzonatate (TESSALON) 100 MG capsule  Bronchitis - Plan: methylPREDNISolone acetate (DEPO-MEDROL) injection 80 mg, azithromycin (ZITHROMAX) 250 MG tablet, benzonatate (TESSALON) 100 MG capsule  Wheezing - Plan: methylPREDNISolone acetate (DEPO-MEDROL) injection 80 mg  Likely  viral/allergic process. Will start on Z-Pak for atypical coverage. Depo-Medrol 80 mg IM x1 for wheezing component. Discuss infectious and respiratory red flags. Tessalon Perles for cough. Followup as needed.

## 2012-12-25 ENCOUNTER — Other Ambulatory Visit: Payer: Self-pay | Admitting: Nurse Practitioner

## 2012-12-25 ENCOUNTER — Telehealth: Payer: Self-pay | Admitting: Nurse Practitioner

## 2012-12-25 MED ORDER — HYDROCHLOROTHIAZIDE 25 MG PO TABS: 25.0000 mg | ORAL_TABLET | Freq: Every day | ORAL | Status: DC

## 2012-12-25 NOTE — Telephone Encounter (Signed)
Can change to HCTZ but won't work as well to get rid of fluid- not sure what is causing fluid retention Sent rx to pharmacy

## 2012-12-25 NOTE — Telephone Encounter (Signed)
Should fell better once gets rid of all fluid

## 2012-12-25 NOTE — Telephone Encounter (Signed)
Patient says the lasix is making her feel so bad is there something else she can take are do? And what is causing all her fluid retention?

## 2012-12-26 NOTE — Telephone Encounter (Signed)
Patient of change will call us back to let us know how it is doing

## 2012-12-31 ENCOUNTER — Other Ambulatory Visit: Payer: Self-pay | Admitting: Family Medicine

## 2012-12-31 NOTE — Telephone Encounter (Signed)
She saw Dr. Alvester Morin last week and there is no order for an inhaler.  I do not know what inhaler she needs And would recommend she follow up unless she has a sample and she could tell the nurse what type she may Have at home if she was given a sample.

## 2013-01-03 ENCOUNTER — Other Ambulatory Visit: Payer: Self-pay | Admitting: Nurse Practitioner

## 2013-01-06 ENCOUNTER — Telehealth: Payer: Self-pay | Admitting: Family Medicine

## 2013-01-06 NOTE — Telephone Encounter (Signed)
Pt having continued ear pain Wants RX called into [facility] in [place]

## 2013-01-06 NOTE — Telephone Encounter (Signed)
Follow up.

## 2013-01-09 NOTE — Telephone Encounter (Signed)
Ear pain is better Does not want appt

## 2013-01-13 ENCOUNTER — Other Ambulatory Visit: Payer: Self-pay

## 2013-01-13 NOTE — Telephone Encounter (Signed)
Has had issues with ? Fluid  Retention in the past.  This medication would potentially worsen this.  Will need follow up to justify.

## 2013-01-13 NOTE — Telephone Encounter (Signed)
Last seen 12/23/12  Dr Alvester Morin  This med not on EPIC list of meds

## 2013-01-21 ENCOUNTER — Other Ambulatory Visit: Payer: Self-pay | Admitting: *Deleted

## 2013-01-26 ENCOUNTER — Other Ambulatory Visit: Payer: Self-pay

## 2013-01-26 NOTE — Telephone Encounter (Signed)
Last seen 12/23/12  Dr Alvester Morin  This med not on EPIC list

## 2013-01-28 MED ORDER — DICLOFENAC SODIUM 75 MG PO TBEC: 75.0000 mg | DELAYED_RELEASE_TABLET | Freq: Two times a day (BID) | ORAL | Status: DC

## 2013-01-28 NOTE — Telephone Encounter (Signed)
Pt will need BP check if she takes this med daily > 2 weeks.

## 2013-02-18 ENCOUNTER — Other Ambulatory Visit: Payer: Self-pay | Admitting: Obstetrics and Gynecology

## 2013-02-18 NOTE — Telephone Encounter (Signed)
refil myrbetriq x 6

## 2013-02-19 ENCOUNTER — Other Ambulatory Visit: Payer: Self-pay

## 2013-02-27 ENCOUNTER — Telehealth: Payer: Self-pay | Admitting: *Deleted

## 2013-02-27 ENCOUNTER — Telehealth: Payer: Self-pay | Admitting: Nurse Practitioner

## 2013-02-27 NOTE — Telephone Encounter (Signed)
appt given for thurs the 20th at 8:30 [facility]

## 2013-03-03 NOTE — Telephone Encounter (Signed)
NA

## 2013-03-04 ENCOUNTER — Ambulatory Visit: Payer: BC Managed Care – PPO | Admitting: Nurse Practitioner

## 2013-03-05 ENCOUNTER — Ambulatory Visit (INDEPENDENT_AMBULATORY_CARE_PROVIDER_SITE_OTHER): Payer: BC Managed Care – PPO | Admitting: Nurse Practitioner

## 2013-03-05 ENCOUNTER — Encounter: Payer: Self-pay | Admitting: Nurse Practitioner

## 2013-03-05 VITALS — BP 134/93 | HR 67 | Temp 97.5°F | Ht 65.0 in | Wt 224.0 lb

## 2013-03-05 DIAGNOSIS — F411 Generalized anxiety disorder: Secondary | ICD-10-CM

## 2013-03-05 DIAGNOSIS — N951 Menopausal and female climacteric states: Secondary | ICD-10-CM

## 2013-03-05 MED ORDER — SERTRALINE HCL 50 MG PO TABS: 50.0000 mg | ORAL_TABLET | Freq: Every day | ORAL | Status: DC

## 2013-03-05 MED ORDER — ALPRAZOLAM 0.25 MG PO TABS: 0.2500 mg | ORAL_TABLET | Freq: Two times a day (BID) | ORAL | Status: DC | PRN

## 2013-03-05 NOTE — Patient Instructions (Addendum)
Stress Management Stress is a state of physical or mental tension that often results from changes in your life or normal routine. Some common causes of stress are:  Death of a loved one.  Injuries or severe illnesses.  Getting fired or changing jobs.  Moving into a new home. Other causes may be:  Sexual problems.  Business or financial losses.  Taking on a large debt.  Regular conflict with someone at home or at work.  Constant tiredness from lack of sleep. It is not just bad things that are stressful. It may be stressful to:  Win the lottery.  Get married.  Buy a new car. The amount of stress that can be easily tolerated varies from person to person. Changes generally cause stress, regardless of the types of change. Too much stress can affect your health. It may lead to physical or emotional problems. Too little stress (boredom) may also become stressful. SUGGESTIONS TO REDUCE STRESS:  Talk things over with your family and friends. It often is helpful to share your concerns and worries. If you feel your problem is serious, you may want to get help from a professional counselor.  Consider your problems one at a time instead of lumping them all together. Trying to take care of everything at once may seem impossible. List all the things you need to do and then start with the most important one. Set a goal to accomplish 2 or 3 things each day. If you expect to do too many in a single day you will naturally fail, causing you to feel even more stressed.  Do not use alcohol or drugs to relieve stress. Although you may feel better for a short time, they do not remove the problems that caused the stress. They can also be habit forming.  Exercise regularly - at least 3 times per week. Physical exercise can help to relieve that "uptight" feeling and will relax you.  The shortest distance between despair and hope is often a good night's sleep.  Go to bed and get up on time allowing  yourself time for appointments without being rushed.  Take a short "time-out" period from any stressful situation that occurs during the day. Close your eyes and take some deep breaths. Starting with the muscles in your face, tense them, hold it for a few seconds, then relax. Repeat this with the muscles in your neck, shoulders, hand, stomach, back and legs.  Take good care of yourself. Eat a balanced diet and get plenty of rest.  Schedule time for having fun. Take a break from your daily routine to relax. HOME CARE INSTRUCTIONS   Call if you feel overwhelmed by your problems and feel you can no longer manage them on your own.  Return immediately if you feel like hurting yourself or someone else. Document Released: 09/26/2000 Document Revised: 06/25/2011 Document Reviewed: 11/25/2012 [facility] Patient Information 2014 [place], [place]. Menopause Menopause is the normal time of life when menstrual periods stop completely. Menopause is complete when you have missed 12 consecutive menstrual periods. It usually occurs between the ages of 48 years and 55 years. Very rarely does a woman develop menopause before the age of 40 years. At menopause, your ovaries stop producing the female hormones estrogen and progesterone. This can cause undesirable symptoms and also affect your health. Sometimes the symptoms may occur 4 5 years before the menopause begins. There is no relationship between menopause and: Oral contraceptives. Number of children you had. Race. The age your menstrual  periods started (menarche). Heavy smokers and very thin women may develop menopause earlier in life. CAUSES The ovaries stop producing the female hormones estrogen and progesterone. Other causes include: Surgery to remove both ovaries. The ovaries stop functioning for no known reason. Tumors of the pituitary gland in the brain. Medical disease that affects the ovaries and hormone production. Radiation treatment to the  abdomen or pelvis. Chemotherapy that affects the ovaries. SYMPTOMS  Hot flashes. Night sweats. Decrease in sex drive. Vaginal dryness and thinning of the vagina causing painful intercourse. Dryness of the skin and developing wrinkles. Headaches. Tiredness. Irritability. Memory problems. Weight gain. Bladder infections. Hair growth of the face and chest. Infertility. More serious symptoms include: Loss of bone (osteoporosis) causing breaks (fractures). Depression. Hardening and narrowing of the arteries (atherosclerosis) causing heart attacks and strokes. DIAGNOSIS  When the menstrual periods have stopped for 12 straight months. Physical exam. Hormone studies of the blood. TREATMENT  There are many treatment choices and nearly as many questions about them. The decisions to treat or not to treat menopausal changes is an individual choice made with your health care provider. Your health care provider can discuss the treatments with you. Together, you can decide which treatment will work best for you. Your treatment choices may include:  Hormone therapy (estrogen and progesterone). Non-hormonal medicines. Treating the individual symptoms with medicine (for example antidepressants for depression). Herbal medicines that may help specific symptoms. Counseling by a psychiatrist or psychologist. Group therapy. Lifestyle changes including: Eating healthy. Regular exercise. Limiting caffeine and alcohol. Stress management and meditation. No treatment. HOME CARE INSTRUCTIONS  Take the medicine your health care provider gives you as directed. Get plenty of sleep and rest. Exercise regularly. Eat a diet that contains calcium (good for the bones) and soy products (acts like estrogen hormone). Avoid alcoholic beverages. Do not smoke. If you have hot flashes, dress in layers. Take supplements, calcium, and vitamin D to strengthen bones. You can use over-the-counter lubricants or  moisturizers for vaginal dryness. Group therapy is sometimes very helpful. Acupuncture may be helpful in some cases. SEEK MEDICAL CARE IF:  You are not sure you are in menopause. You are having menopausal symptoms and need advice and treatment. You are still having menstrual periods after age 33 years. You have pain with intercourse. Menopause is complete (no menstrual period for 12 months) and you develop vaginal bleeding. You need a referral to a specialist (gynecologist, psychiatrist, or psychologist) for treatment. SEEK IMMEDIATE MEDICAL CARE IF:  You have severe depression. You have excessive vaginal bleeding. You fell and think you have a broken bone. You have pain when you urinate. You develop leg or chest pain. You have a fast pounding heart beat (palpitations). You have severe headaches. You develop vision problems. You feel a lump in your breast. You have abdominal pain or severe indigestion. Document Released: 06/23/2003 Document Revised: 12/03/2012 Document Reviewed: 10/30/2012 [facility] Patient Information 2014 [place], [place].

## 2013-03-05 NOTE — Progress Notes (Signed)
  Subjective:    Patient ID: Margaret Cruz, female    DOB: [birth year], 42 y.o.   MRN: [id number]  HPI Patinet in today saying that she is starting menopausal- she had an endometrial ablation in July of 2014- since then she still has periods but usually brownish blood no bright red blood- she says that she wakes up every night in a sweat- she is very moody and cries easily.    Review of Systems  All other systems reviewed and are negative.       Objective:   Physical Exam  Constitutional: She appears well-developed and well-nourished.  Cardiovascular: Normal rate, regular rhythm and normal heart sounds.   Pulmonary/Chest: Effort normal and breath sounds normal.  Abdominal: Soft. Bowel sounds are normal. She exhibits no distension. There is no tenderness. There is no rebound.  Psychiatric: She has a normal mood and affect. Her behavior is normal. Judgment and thought content normal.     BP 134/93  Pulse 67  Temp(Src) 97.5 F (36.4 C) (Oral)  Ht 5\' 5"  (1.651 m)  Wt 224 lb (101.606 kg)  BMI 37.28 kg/m2  LMP 02/19/2013      Assessment & Plan:   1. Perimenopausal vasomotor symptoms   2. GAD (generalized anxiety disorder)    Orders Placed This Encounter  Procedures  . FSH/LH   Meds ordered this encounter  Medications  . sertraline (ZOLOFT) 50 MG tablet    Sig: Take 1 tablet (50 mg total) by mouth daily.    Dispense:  30 tablet    Refill:  3    Order Specific Question:  Supervising Provider    Answer:  Ernestina Penna [[id number]]  . ALPRAZolam (XANAX) 0.25 MG tablet    Sig: Take 1 tablet (0.25 mg total) by mouth 2 (two) times daily as needed for anxiety.    Dispense:  60 tablet    Refill:  0    Order Specific Question:  Supervising Provider    Answer:  Deborra Medina    Continue all meds Labs pending Diet and exercise encouraged Health maintenance reviewed Follow up in 3 weeks  Margaret Daphine Deutscher, FNP

## 2013-03-26 ENCOUNTER — Ambulatory Visit: Payer: BC Managed Care – PPO | Admitting: Nurse Practitioner

## 2013-03-30 ENCOUNTER — Telehealth: Payer: Self-pay | Admitting: Nurse Practitioner

## 2013-04-01 NOTE — Telephone Encounter (Signed)
Message left for patient to call back to re-schedule.  It will probably be the first of the year before she can be seen.

## 2013-04-08 ENCOUNTER — Ambulatory Visit: Payer: BC Managed Care – PPO | Admitting: Nurse Practitioner

## 2013-04-28 ENCOUNTER — Other Ambulatory Visit: Payer: Self-pay

## 2013-04-28 DIAGNOSIS — G2581 Restless legs syndrome: Secondary | ICD-10-CM

## 2013-04-28 MED ORDER — HYDROCHLOROTHIAZIDE 25 MG PO TABS: 25.0000 mg | ORAL_TABLET | Freq: Every day | ORAL | Status: DC

## 2013-04-28 MED ORDER — DICLOFENAC SODIUM 75 MG PO TBEC: 75.0000 mg | DELAYED_RELEASE_TABLET | Freq: Two times a day (BID) | ORAL | Status: DC

## 2013-04-28 MED ORDER — PRAMIPEXOLE DIHYDROCHLORIDE 0.5 MG PO TABS: 0.5000 mg | ORAL_TABLET | Freq: Every day | ORAL | Status: DC

## 2013-06-08 ENCOUNTER — Other Ambulatory Visit: Payer: Self-pay | Admitting: Nurse Practitioner

## 2013-06-17 ENCOUNTER — Ambulatory Visit: Payer: BC Managed Care – PPO | Admitting: Nurse Practitioner

## 2013-06-22 ENCOUNTER — Encounter: Payer: Self-pay | Admitting: General Practice

## 2013-06-22 ENCOUNTER — Ambulatory Visit (INDEPENDENT_AMBULATORY_CARE_PROVIDER_SITE_OTHER): Payer: BC Managed Care – PPO | Admitting: General Practice

## 2013-06-22 VITALS — BP 110/76 | HR 73 | Temp 97.0°F | Ht 65.0 in | Wt 228.0 lb

## 2013-06-22 DIAGNOSIS — J029 Acute pharyngitis, unspecified: Secondary | ICD-10-CM

## 2013-06-22 LAB — POCT RAPID STREP A (OFFICE): Rapid Strep A Screen: POSITIVE — AB

## 2013-06-22 MED ORDER — AMOXICILLIN 875 MG PO TABS: 875.0000 mg | ORAL_TABLET | Freq: Two times a day (BID) | ORAL | Status: DC

## 2013-06-22 NOTE — Patient Instructions (Signed)
Pharyngitis °Pharyngitis is redness, pain, and swelling (inflammation) of your pharynx.  °CAUSES  °Pharyngitis is usually caused by infection. Most of the time, these infections are from viruses (viral) and are part of a cold. However, sometimes pharyngitis is caused by bacteria (bacterial). Pharyngitis can also be caused by allergies. Viral pharyngitis may be spread from person to person by coughing, sneezing, and personal items or utensils (cups, forks, spoons, toothbrushes). Bacterial pharyngitis may be spread from person to person by more intimate contact, such as kissing.  °SIGNS AND SYMPTOMS  °Symptoms of pharyngitis include:   °· Sore throat.   °· Tiredness (fatigue).   °· Low-grade fever.   °· Headache. °· Joint pain and muscle aches. °· Skin rashes. °· Swollen lymph nodes. °· Plaque-like film on throat or tonsils (often seen with bacterial pharyngitis). °DIAGNOSIS  °Your health care provider will ask you questions about your illness and your symptoms. Your medical history, along with a physical exam, is often all that is needed to diagnose pharyngitis. Sometimes, a rapid strep test is done. Other lab tests may also be done, depending on the suspected cause.  °TREATMENT  °Viral pharyngitis will usually get better in 3 4 days without the use of medicine. Bacterial pharyngitis is treated with medicines that kill germs (antibiotics).  °HOME CARE INSTRUCTIONS  °· Drink enough water and fluids to keep your urine clear or pale yellow.   °· Only take over-the-counter or prescription medicines as directed by your health care provider:   °· If you are prescribed antibiotics, make sure you finish them even if you start to feel better.   °· Do not take aspirin.   °· Get lots of rest.   °· Gargle with 8 oz of salt water (½ tsp of salt per 1 qt of water) as often as every 1 2 hours to soothe your throat.   °· Throat lozenges (if you are not at risk for choking) or sprays may be used to soothe your throat. °SEEK MEDICAL  CARE IF:  °· You have large, tender lumps in your neck. °· You have a rash. °· You cough up green, yellow-brown, or bloody spit. °SEEK IMMEDIATE MEDICAL CARE IF:  °· Your neck becomes stiff. °· You drool or are unable to swallow liquids. °· You vomit or are unable to keep medicines or liquids down. °· You have severe pain that does not go away with the use of recommended medicines. °· You have trouble breathing (not caused by a stuffy nose). °MAKE SURE YOU:  °· Understand these instructions. °· Will watch your condition. °· Will get help right away if you are not doing well or get worse. °Document Released: 04/02/2005 Document Revised: 01/21/2013 Document Reviewed: 12/08/2012 °[employer]® Patient Information ©2014 [employer]. ° °

## 2013-06-22 NOTE — Progress Notes (Signed)
   Subjective:    Patient ID: Margaret Cruz, female    DOB: [birth year], 43 y.o.   MRN: [id number]  Sore Throat  This is a new problem. The current episode started in the past 7 days. The problem has been gradually worsening. Neither side of throat is experiencing more pain than the other. There has been no fever. Associated symptoms include coughing. Pertinent negatives include no congestion, ear pain, headaches, shortness of breath, trouble swallowing or vomiting. She has had no exposure to strep. She has tried acetaminophen for the symptoms.      Review of Systems  Constitutional: Negative for fever and chills.  HENT: Positive for sore throat. Negative for congestion, ear pain and trouble swallowing.   Respiratory: Positive for cough. Negative for chest tightness and shortness of breath.   Cardiovascular: Negative for chest pain.  Gastrointestinal: Negative for vomiting.  Neurological: Negative for dizziness, weakness and headaches.       Objective:   Physical Exam  Constitutional: She appears well-developed and well-nourished.  HENT:  Head: Normocephalic and atraumatic.  Right Ear: External ear normal.  Left Ear: External ear normal.  Nose: Nose normal.  Mouth/Throat: Posterior oropharyngeal erythema present.  Cardiovascular: Normal rate and normal heart sounds.   Pulmonary/Chest: Effort normal and breath sounds normal. No respiratory distress.     Results for orders placed in visit on 06/22/13  POCT RAPID STREP A (OFFICE)      Result Value Ref Range   Rapid Strep A Screen Positive (*) Negative        Assessment & Plan:  1. Sore throat  - POCT rapid strep A  2. Acute pharyngitis  - amoxicillin (AMOXIL) 875 MG tablet; Take 1 tablet (875 mg total) by mouth 2 (two) times daily.  Dispense: 20 tablet; Refill: 0 -increase fluids -tylenol motrin or tylenol for mild discomfort -Patient verbalized understanding Erby Pian, FNP-C

## 2013-06-26 ENCOUNTER — Other Ambulatory Visit: Payer: Self-pay | Admitting: General Practice

## 2013-07-15 ENCOUNTER — Telehealth: Payer: Self-pay | Admitting: Nurse Practitioner

## 2013-07-20 NOTE — Telephone Encounter (Signed)
Left detailed message to call back if still needs to be seen.

## 2013-07-21 ENCOUNTER — Telehealth: Payer: Self-pay | Admitting: Nurse Practitioner

## 2013-07-21 MED ORDER — PRAMIPEXOLE DIHYDROCHLORIDE 1 MG PO TABS: 1.0000 mg | ORAL_TABLET | Freq: Every evening | ORAL | Status: DC | PRN

## 2013-07-21 NOTE — Telephone Encounter (Signed)
Increased mirapex to 1mg  Every night- rx sent to pharmacy

## 2013-07-21 NOTE — Telephone Encounter (Signed)
Prescribed Mirapex 0.5mg  for restless legs. It hasn't worked well over the past 2 weeks so she has taken 2 and now is out of medication.   Doubling has helped with the RLS but she also developed sharp throbbing pains over the last month. They are mainly in the right leg. Appt scheduled to evaluate leg pain. She will call back if symptoms worsen.   Can we write a new prescription Mirapex 1mg .

## 2013-07-23 ENCOUNTER — Ambulatory Visit: Payer: BC Managed Care – PPO | Admitting: Nurse Practitioner

## 2013-08-11 ENCOUNTER — Telehealth: Payer: Self-pay

## 2013-08-11 DIAGNOSIS — N644 Mastodynia: Secondary | ICD-10-CM

## 2013-08-11 NOTE — Telephone Encounter (Signed)
ordred mammo to be done at [facility]

## 2013-08-11 NOTE — Telephone Encounter (Signed)
Went to the [facility] bus and was told they couldn't do it because of pain in L breast and discharge   Need an order put in for a Dx Mammo to [place] not had a mammo in 13 years

## 2013-08-13 ENCOUNTER — Other Ambulatory Visit: Payer: Self-pay | Admitting: *Deleted

## 2013-08-13 DIAGNOSIS — N644 Mastodynia: Secondary | ICD-10-CM

## 2013-08-17 ENCOUNTER — Other Ambulatory Visit: Payer: Self-pay | Admitting: Nurse Practitioner

## 2013-08-17 ENCOUNTER — Other Ambulatory Visit: Payer: Self-pay

## 2013-08-17 DIAGNOSIS — N644 Mastodynia: Secondary | ICD-10-CM

## 2013-08-27 ENCOUNTER — Ambulatory Visit
Admission: RE | Admit: 2013-08-27 | Discharge: 2013-08-27 | Disposition: A | Discharge status: Home / Self Care | Payer: BC Managed Care – PPO | Source: Ambulatory Visit | Attending: Nurse Practitioner | Admitting: Nurse Practitioner

## 2013-08-27 DIAGNOSIS — N644 Mastodynia: Secondary | ICD-10-CM

## 2013-08-31 ENCOUNTER — Telehealth: Payer: Self-pay | Admitting: Nurse Practitioner

## 2013-08-31 NOTE — Telephone Encounter (Signed)
appt given for tomorrow with mary martin 

## 2013-09-01 ENCOUNTER — Ambulatory Visit (INDEPENDENT_AMBULATORY_CARE_PROVIDER_SITE_OTHER): Payer: BC Managed Care – PPO | Admitting: Nurse Practitioner

## 2013-09-01 ENCOUNTER — Encounter: Payer: Self-pay | Admitting: Nurse Practitioner

## 2013-09-01 VITALS — BP 128/81 | HR 67 | Temp 98.1°F | Ht 65.0 in | Wt 229.8 lb

## 2013-09-01 DIAGNOSIS — N926 Irregular menstruation, unspecified: Secondary | ICD-10-CM

## 2013-09-01 DIAGNOSIS — N6459 Other signs and symptoms in breast: Secondary | ICD-10-CM

## 2013-09-01 DIAGNOSIS — N6452 Nipple discharge: Secondary | ICD-10-CM

## 2013-09-01 MED ORDER — DICLOFENAC SODIUM 75 MG PO TBEC: DELAYED_RELEASE_TABLET | ORAL | Status: DC

## 2013-09-01 NOTE — Progress Notes (Signed)
   Subjective:    Patient ID: Margaret Cruz, female    DOB: 04-10-1971, 43 y.o.   MRN: [id number]  HPI Patient in c/o irregular menses and breast tenderness. Only bled one day last month and none this month- SHe says that she wakes up at night sweating. Her mom had a hysterectomy and did nit go through menopause. She also says that she has bilateral nipple discharge taht started 6 months ago- clear to white in color.    Review of Systems  Constitutional: Negative.   HENT: Negative.   Respiratory: Negative.   Cardiovascular: Negative.   Genitourinary: Negative.   Psychiatric/Behavioral: Negative.   All other systems reviewed and are negative.      Objective:   Physical Exam  Constitutional: She is oriented to person, place, and time. She appears well-developed and well-nourished.  Cardiovascular: Normal rate, regular rhythm and normal heart sounds.   Pulmonary/Chest: Effort normal and breath sounds normal. Right breast exhibits nipple discharge (thick white). Nipple discharge: thick white.  Neurological: She is alert and oriented to person, place, and time.  Skin: Skin is warm and dry.  Psychiatric: She has a normal mood and affect. Her behavior is normal. Judgment and thought content normal.    BP 128/81  Pulse 67  Temp(Src) 98.1 F (36.7 C) (Oral)  Ht 5\' 5"  (1.651 m)  Wt 229 lb 12.8 oz (104.237 kg)  BMI 38.24 kg/m2  LMP 07/19/2013       Assessment & Plan:   1. Irregular menses   2. Nipple discharge    Orders Placed This Encounter  Procedures  . Aerobic culture    nipple  . Prolactin  . FSH/LH   Will wait on labs to come back  [place], FNP

## 2013-09-01 NOTE — Patient Instructions (Signed)
Galactorrhea Galactorrhea is when there is a milky nipple discharge. It is different from normal milk in nursing mothers. It usually comes from both nipples. Galactorrhea is not a disease but may be a symptom of a problem. It may continue for years after weaning. Galactorrhea is caused by the hormone prolactin, which stimulates milk production. If the breast discharge looks like pus, is bloody or if there is a lump present in the affected breast, the discharge may be caused by other problems including:  A benign cyst.  Papilloma.  Breast cancer.  A breast infection.  A breast abscess. It can also be seen in men who have a low or absent female hormone (testosterone) level. Galactorrhea can be present in a newborn if the mother had high female hormone (estrogen) levels that crossed into the baby through the placenta. The baby usually has enlarged breasts, but in time, it all goes away on its own. CAUSES   Tumor of the pituitary gland in the brain.  Problems with the hypothalamus in the brain that stimulates the pituitary gland.  Low thyroid function (hypothyroid disease).  Chronic kidney failure.  Medications, antidepressants, tranquilizers and blood pressure medication.  Herbal medications (nettle, fennel, blessed thistle, anise and fenugreek seed).  Illegal drugs (marijuana and opiates).  Breast stimulation during sexual activity or too many and frequent self breast exams.  Birth control pills.  Surgery or trauma to the breast causing nerve damage.  Spinal cord injury. SYMPTOMS   White, yellow or green discharge from one or both breasts.  No menstrual period (amenorrhea) or infrequent menstrual periods (hypomenorrhea).  Hot flashes, lack of sexual desire or vaginal dryness.  Infertility in women and men.  Headaches and vision problems.  Decrease in calcium in your bones (developing osteopenia or osteoporosis). DIAGNOSIS  Your caregiver may be able to know your problem  by taking a detailed history and physical exam of you. Tests that may be done, include:  Blood tests to check for the prolactin hormone, your female and thyroid hormones and a pregnancy test.  A detailed eye exam.  Mammogram.  X-rays, CT scan or MRI of breasts or your brain looking for a tumor. TREATMENT   Stopping medications that may be causing the galactorrhea.  Treating low thyroid function with thyroid hormones.  Medical or surgical (if necessary) treatment of a pituitary gland tumor.  Medication to lower the prolactin hormone level when no cause can be found.  Surgery as a last resort to remove the breasts ducts if the discharge persists with treatment and is a problem.  Treatment may not be necessary if you are not bothered by the breast discharge. HOME CARE INSTRUCTIONS   Before seeing your caregiver, make a list of all your symptoms, medications, when the breast discharge started and questions you may have.  Avoid breast stimulation during sexual activity.  Perform breast self exam once a month.  Avoid clothes that rub on your nipples.  Use breasts pads to absorb the discharge.  Wear a support bra. SEEK MEDICAL CARE IF:   You have galactorrhea and you are trying to get pregnant.  You develop hot flashes, vaginal dryness or lack of sexual desire.  You stop having menstrual periods or they are irregular or far apart.  You have headaches.  You have vision problems. SEEK IMMEDIATE MEDICAL CARE IF:   Your breast discharge is bloody or pus-like.  You have breast pain.  You feel a lump in your breast.  Your breast shows wrinkling or   dimpling.  Your breast becomes red and swollen. Document Released: 05/10/2004 Document Revised: 06/25/2011 Document Reviewed: 03/23/2008 [facility] Patient Information 2014 [place].

## 2013-09-02 LAB — FSH/LH
FSH: 7.4 m[IU]/mL
LH: 5.5 m[IU]/mL

## 2013-09-02 LAB — PROLACTIN: PROLACTIN: 13.4 ng/mL (ref 4.8–23.3)

## 2013-09-03 LAB — AEROBIC CULTURE

## 2013-09-09 ENCOUNTER — Other Ambulatory Visit: Payer: Self-pay | Admitting: Nurse Practitioner

## 2013-09-10 NOTE — Telephone Encounter (Signed)
Last labs 11/2012 

## 2013-09-10 NOTE — Telephone Encounter (Signed)
Patient NTBS for follow up and lab work  

## 2013-09-11 ENCOUNTER — Telehealth: Payer: Self-pay | Admitting: Nurse Practitioner

## 2013-09-11 NOTE — Telephone Encounter (Signed)
appt given for tues and advised that someone will be here in the am if she needs to come in then. She wanted tues because she was off work

## 2013-09-15 ENCOUNTER — Encounter: Payer: Self-pay | Admitting: Nurse Practitioner

## 2013-09-15 ENCOUNTER — Ambulatory Visit (INDEPENDENT_AMBULATORY_CARE_PROVIDER_SITE_OTHER): Payer: BC Managed Care – PPO | Admitting: Nurse Practitioner

## 2013-09-15 VITALS — BP 120/78 | HR 66 | Temp 97.8°F | Ht 65.0 in | Wt 227.4 lb

## 2013-09-15 DIAGNOSIS — R609 Edema, unspecified: Secondary | ICD-10-CM

## 2013-09-15 MED ORDER — SPIRONOLACTONE 50 MG PO TABS: 50.0000 mg | ORAL_TABLET | Freq: Every day | ORAL | Status: DC

## 2013-09-15 NOTE — Patient Instructions (Signed)

## 2013-09-15 NOTE — Progress Notes (Signed)
   Subjective:    Patient ID: Margaret Cruz, female    DOB: [birth year], 43 y.o.   MRN: [id number]  HPI Patietn in c/o peripheral edema- Happening daily- Ankles will get so big that they sag over shoes. She is currently on HCTZ. Has tried lasix in the past but that completely drains her and she can't tolerate it. N problems with blood pressure.    Review of Systems  Constitutional: Negative.   HENT: Negative.   Respiratory: Negative.   Cardiovascular: Negative.   Genitourinary: Negative.   Psychiatric/Behavioral: Negative.   All other systems reviewed and are negative.      Objective:   Physical Exam  Constitutional: She is oriented to person, place, and time. She appears well-developed and well-nourished.  Cardiovascular: Normal rate, regular rhythm and normal heart sounds.   Pulmonary/Chest: Effort normal and breath sounds normal.  Musculoskeletal: She exhibits edema (1+).  Neurological: She is alert and oriented to person, place, and time.  Skin: Skin is warm and dry.  Psychiatric: She has a normal mood and affect. Her behavior is normal. Judgment and thought content normal.   BP 120/78  Pulse 66  Temp(Src) 97.8 F (36.6 C) (Oral)  Ht 5\' 5"  (1.651 m)  Wt 227 lb 6.4 oz (103.148 kg)  BMI 37.84 kg/m2  LMP 07/19/2013        Assessment & Plan:   1. Peripheral edema    Meds ordered this encounter  Medications  . spironolactone (ALDACTONE) 50 MG tablet    Sig: Take 1 tablet (50 mg total) by mouth daily.    Dispense:  30 tablet    Refill:  5    Order Specific Question:  Supervising Provider    Answer:  Chipper Herb [[id number]]   Start out with 1/2 of spirnolactone daily Elevate legs when sitting Call if not helping or can't tolerate meds  Margaret Hassell Done, FNP

## 2013-09-25 ENCOUNTER — Other Ambulatory Visit: Payer: Self-pay | Admitting: Nurse Practitioner

## 2013-09-28 NOTE — Telephone Encounter (Signed)
Last ov 09/15/13. Last refill 03/05/13.

## 2013-09-28 NOTE — Telephone Encounter (Signed)
Please call in xanax with 1 refills 

## 2013-09-29 NOTE — Telephone Encounter (Signed)
Called into [facility]

## 2013-10-05 ENCOUNTER — Telehealth: Payer: Self-pay | Admitting: Nurse Practitioner

## 2013-10-05 ENCOUNTER — Ambulatory Visit (INDEPENDENT_AMBULATORY_CARE_PROVIDER_SITE_OTHER): Payer: BC Managed Care – PPO | Admitting: Nurse Practitioner

## 2013-10-05 ENCOUNTER — Encounter: Payer: Self-pay | Admitting: Nurse Practitioner

## 2013-10-05 VITALS — BP 128/82 | HR 82 | Temp 97.5°F | Ht 65.0 in | Wt 230.4 lb

## 2013-10-05 DIAGNOSIS — S91009A Unspecified open wound, unspecified ankle, initial encounter: Secondary | ICD-10-CM

## 2013-10-05 DIAGNOSIS — S81809A Unspecified open wound, unspecified lower leg, initial encounter: Secondary | ICD-10-CM

## 2013-10-05 DIAGNOSIS — S81009A Unspecified open wound, unspecified knee, initial encounter: Secondary | ICD-10-CM

## 2013-10-05 DIAGNOSIS — S81812A Laceration without foreign body, left lower leg, initial encounter: Secondary | ICD-10-CM

## 2013-10-05 MED ORDER — CIPROFLOXACIN HCL 500 MG PO TABS: 500.0000 mg | ORAL_TABLET | Freq: Two times a day (BID) | ORAL | Status: DC

## 2013-10-05 NOTE — Patient Instructions (Signed)

## 2013-10-05 NOTE — Telephone Encounter (Signed)
appt given for today with mmm

## 2013-10-05 NOTE — Progress Notes (Signed)
   Subjective:    Patient ID: Margaret Cruz, female    DOB: [birth year], 43 y.o.   MRN: [id number]  HPI Patient hit her right lower leg on bedrail of bed at 1am on her way to bed. Has a small skintear that is very sore and draining.    Review of Systems  Constitutional: Negative.   HENT: Negative.   Respiratory: Negative.   Cardiovascular: Negative.   Neurological: Negative.   Psychiatric/Behavioral: Negative.   All other systems reviewed and are negative.      Objective:   Physical Exam  Constitutional: She is oriented to person, place, and time. She appears well-developed and well-nourished.  Cardiovascular: Normal rate, regular rhythm and normal heart sounds.   Pulmonary/Chest: Effort normal and breath sounds normal.  Neurological: She is alert and oriented to person, place, and time.  Skin: Skin is warm.  3cm irregular shape skin tear to right lower leg lateral side.  Psychiatric: She has a normal mood and affect. Her behavior is normal. Judgment and thought content normal.   BP 128/82  Pulse 82  Temp(Src) 97.5 F (36.4 C) (Oral)  Ht 5\' 5"  (1.651 m)  Wt 230 lb 6.4 oz (104.509 kg)  BMI 38.34 kg/m2  Wound cleaned with betadine and tripple antibiotic ointment- tegaderm dressing      Assessment & Plan:   1. Skin tear of lower leg without complication, left, initial encounter    Meds ordered this encounter  Medications  . hydrochlorothiazide (HYDRODIURIL) 25 MG tablet    Sig:   . ciprofloxacin (CIPRO) 500 MG tablet    Sig: Take 1 tablet (500 mg total) by mouth 2 (two) times daily.    Dispense:  20 tablet    Refill:  0    Order Specific Question:  Supervising Provider    Answer:  Chipper Herb [[id number]]   Wound care discussed  [name] Done, FNP

## 2013-10-12 ENCOUNTER — Ambulatory Visit: Payer: BC Managed Care – PPO | Admitting: Nurse Practitioner

## 2013-11-07 ENCOUNTER — Other Ambulatory Visit: Payer: Self-pay | Admitting: Nurse Practitioner

## 2013-11-27 ENCOUNTER — Other Ambulatory Visit: Payer: Self-pay | Admitting: Nurse Practitioner

## 2013-12-22 ENCOUNTER — Other Ambulatory Visit: Payer: Self-pay | Admitting: Nurse Practitioner

## 2013-12-23 NOTE — Telephone Encounter (Signed)
Last ov 6/15

## 2014-02-15 ENCOUNTER — Encounter: Payer: Self-pay | Admitting: Nurse Practitioner

## 2014-05-24 ENCOUNTER — Other Ambulatory Visit: Payer: Self-pay | Admitting: Nurse Practitioner

## 2014-05-24 MED ORDER — LINDANE 1 % EX SHAM: 1.0000 "application " | MEDICATED_SHAMPOO | Freq: Once | CUTANEOUS | Status: DC

## 2014-07-02 ENCOUNTER — Other Ambulatory Visit: Payer: Self-pay | Admitting: Nurse Practitioner

## 2014-07-05 ENCOUNTER — Other Ambulatory Visit: Payer: Self-pay | Admitting: Nurse Practitioner

## 2014-07-05 MED ORDER — PRAMIPEXOLE DIHYDROCHLORIDE 1 MG PO TABS: 1.0000 mg | ORAL_TABLET | Freq: Every evening | ORAL | Status: DC | PRN

## 2014-07-05 NOTE — Telephone Encounter (Signed)
Last seen 10/05/13 MMM  Requesting 90 day supply

## 2014-09-06 ENCOUNTER — Other Ambulatory Visit: Payer: Self-pay | Admitting: Orthopedic Surgery

## 2014-09-08 ENCOUNTER — Other Ambulatory Visit (HOSPITAL_COMMUNITY): Payer: Self-pay | Admitting: *Deleted

## 2014-09-08 NOTE — Pre-Procedure Instructions (Signed)
    GENESI STEFANKO  09/08/2014      [facility] [address], [place] - [address] [place] [address] Phone: [phone] Fax: [phone]  [facility] [address] [place] [address] [place] [address] Phone: [phone] Fax: [phone]    Your procedure is scheduled on Thursday, September 16, 2014 at 2:15 PM.   Report to [facility] Entrance "A" Admitting Office at 11:00 AM.   Call this number if you have problems the morning of surgery: [phone]               Any questions prior to day of surgery, please call [phone] between 8 & 4 PM.   Remember:  Do not eat food or drink liquids after midnight Wednesday, 09/15/14.  Take these medicines the morning of surgery with A SIP OF WATER:   Do not wear jewelry, make-up or nail polish.  Do not wear lotions, powders, or perfumes.  You may wear deodorant.  Do not shave 48 hours prior to surgery.    Do not bring valuables to the hospital.  [facility] is not responsible for any belongings or valuables.  Contacts, dentures or bridgework may not be worn into surgery.  Leave your suitcase in the car.  After surgery it may be brought to your room.  For patients admitted to the hospital, discharge time will be determined by your treatment team.  Patients discharged the day of surgery will not be allowed to drive home.   Special instructions:  See "Preparing for Surgery" Instruction sheet.   Please read over the following fact sheets that you were given. Pain Booklet, Coughing and Deep Breathing, MRSA Information and Surgical Site Infection Prevention

## 2014-09-09 ENCOUNTER — Encounter (HOSPITAL_COMMUNITY)
Admission: RE | Admit: 2014-09-09 | Discharge: 2014-09-09 | Disposition: A | Discharge status: Home / Self Care | Payer: BLUE CROSS/BLUE SHIELD | Source: Ambulatory Visit | Attending: Orthopedic Surgery | Admitting: Orthopedic Surgery

## 2014-09-09 ENCOUNTER — Ambulatory Visit (HOSPITAL_COMMUNITY)
Admission: RE | Admit: 2014-09-09 | Discharge: 2014-09-09 | Disposition: A | Discharge status: Home / Self Care | Payer: BLUE CROSS/BLUE SHIELD | Source: Ambulatory Visit | Attending: Orthopedic Surgery | Admitting: Orthopedic Surgery

## 2014-09-09 ENCOUNTER — Encounter (HOSPITAL_COMMUNITY): Payer: Self-pay

## 2014-09-09 DIAGNOSIS — Z01818 Encounter for other preprocedural examination: Secondary | ICD-10-CM | POA: Insufficient documentation

## 2014-09-09 DIAGNOSIS — Z01812 Encounter for preprocedural laboratory examination: Secondary | ICD-10-CM | POA: Insufficient documentation

## 2014-09-09 DIAGNOSIS — R9431 Abnormal electrocardiogram [ECG] [EKG]: Secondary | ICD-10-CM | POA: Insufficient documentation

## 2014-09-09 HISTORY — DX: Irritable bowel syndrome, unspecified: K58.9

## 2014-09-09 HISTORY — DX: Unspecified osteoarthritis, unspecified site: M19.90

## 2014-09-09 HISTORY — DX: Family history of other specified conditions: Z84.89

## 2014-09-09 HISTORY — DX: Reserved for inherently not codable concepts without codable children: IMO0001

## 2014-09-09 LAB — HCG, SERUM, QUALITATIVE: Preg, Serum: NEGATIVE

## 2014-09-09 LAB — COMPREHENSIVE METABOLIC PANEL
ALT: 14 U/L (ref 14–54)
AST: 15 U/L (ref 15–41)
Albumin: 3.4 g/dL — ABNORMAL LOW (ref 3.5–5.0)
Alkaline Phosphatase: 50 U/L (ref 38–126)
Anion gap: 7 (ref 5–15)
BILIRUBIN TOTAL: 0.6 mg/dL (ref 0.3–1.2)
BUN: <5 mg/dL — ABNORMAL LOW (ref 6–20)
CHLORIDE: 105 mmol/L (ref 101–111)
CO2: 27 mmol/L (ref 22–32)
CREATININE: 0.7 mg/dL (ref 0.44–1.00)
Calcium: 9 mg/dL (ref 8.9–10.3)
GFR calc non Af Amer: >60 mL/min (ref >60)
Glucose, Bld: 83 mg/dL (ref 65–99)
Potassium: 3.6 mmol/L (ref 3.5–5.1)
SODIUM: 139 mmol/L (ref 135–145)
Total Protein: 6.2 g/dL — ABNORMAL LOW (ref 6.5–8.1)

## 2014-09-09 LAB — CBC WITH DIFFERENTIAL/PLATELET
BASOS ABS: 0 10*3/uL (ref 0.0–0.1)
BASOS PCT: 0 % (ref 0–1)
EOS PCT: 2 % (ref 0–5)
Eosinophils Absolute: 0.1 10*3/uL (ref 0.0–0.7)
HCT: 41.7 % (ref 36.0–46.0)
Hemoglobin: 13.7 g/dL (ref 12.0–15.0)
Lymphocytes Relative: 29 % (ref 12–46)
Lymphs Abs: 1.8 10*3/uL (ref 0.7–4.0)
MCH: 28.5 pg (ref 26.0–34.0)
MCHC: 32.9 g/dL (ref 30.0–36.0)
MCV: 86.9 fL (ref 78.0–100.0)
MONO ABS: 0.4 10*3/uL (ref 0.1–1.0)
Monocytes Relative: 6 % (ref 3–12)
NEUTROS ABS: 3.9 10*3/uL (ref 1.7–7.7)
Neutrophils Relative %: 63 % (ref 43–77)
PLATELETS: 246 10*3/uL (ref 150–400)
RBC: 4.8 MIL/uL (ref 3.87–5.11)
RDW: 13.3 % (ref 11.5–15.5)
WBC: 6.1 10*3/uL (ref 4.0–10.5)

## 2014-09-09 LAB — URINALYSIS, ROUTINE W REFLEX MICROSCOPIC
BILIRUBIN URINE: NEGATIVE
GLUCOSE, UA: NEGATIVE mg/dL
Hgb urine dipstick: NEGATIVE
KETONES UR: NEGATIVE mg/dL
LEUKOCYTES UA: NEGATIVE
NITRITE: NEGATIVE
PROTEIN: NEGATIVE mg/dL
Specific Gravity, Urine: 1.016 (ref 1.005–1.030)
UROBILINOGEN UA: 0.2 mg/dL (ref 0.0–1.0)
pH: 7 (ref 5.0–8.0)

## 2014-09-09 LAB — SURGICAL PCR SCREEN
MRSA, PCR: NEGATIVE
Staphylococcus aureus: NEGATIVE

## 2014-09-09 LAB — APTT: aPTT: 28 seconds (ref 24–37)

## 2014-09-09 LAB — PROTIME-INR
INR: 0.85 (ref 0.00–1.49)
Prothrombin Time: 11.8 seconds (ref 11.6–15.2)

## 2014-09-09 NOTE — Pre-Procedure Instructions (Signed)
    Margaret Cruz  09/09/2014        Your procedure is scheduled on Thursday, June 2.              Report to [facility] Admitting at 11:00 A.M.  Call this number if you have problems the morning of surgery:  [phone]   Remember:  Do not eat food or drink liquids after midnight.  Take these medicines the morning of surgery with A SIP OF WATER  Take if needed: Hydrocodone- Acetominophen .    Do not wear jewelry, make-up or nail polish.  Do not wear lotions, powders, or perfumes.    Do not shave 48 hours prior to surgery.    Do not bring valuables to the hospital  [facility] is not responsible for any belongings or valuables.  Contacts, dentures or bridgework may not be worn into surgery.  Leave your suitcase in the car.  After surgery it may be brought to your room.  For patients admitted to the hospital, discharge time will be determined by your treatment team.  Patients discharged the day of surgery will not be allowed to drive home.   Name and phone number of your driver:   - Special instructions: Review  [place] - Preparing For Surgery.  Please read over the following fact sheets that you were given. Pain Booklet, Coughing and Deep Breathing and Surgical Site Infection Prevention

## 2014-09-09 NOTE — Progress Notes (Addendum)
Margaret Cruz denies chest pain or shortness of breath. Does not see a cardiologist on a routine basis. Patient saw Dr Jenkins Rouge in 2013 after a bout with palpations and chest pain.  21 day cardiac monitor di not show any arrhythmias.  Echo done.  Dr Jenkins Rouge did not see need to do further testing, BP stable and patient trying to loss weight. Margaret Cruz reports to me that she has lost some weight since then and is no longer having palpations or chest pain.

## 2014-09-09 NOTE — Pre-Procedure Instructions (Signed)
    Margaret Cruz  09/09/2014        Your procedure is scheduled on Thursday, June 2.              Report to [place] at 11:00 A.M.  Call this number if you have problems the morning of surgery:  [phone]   Remember:  Do not eat food or drink liquids after midnight.  Take these medicines the morning of surgery with A SIP OF WATER  Take if needed: Hydrocodone- Acetominophen .    Do not wear jewelry, make-up or nail polish.  Do not wear lotions, powders, or perfumes.    Do not shave 48 hours prior to surgery.    Do not bring valuables to the hospital  [facility] is not responsible for any belongings or valuables.  Contacts, dentures or bridgework may not be worn into surgery.  Leave your suitcase in the car.  After surgery it may be brought to your room.  For patients admitted to the hospital, discharge time will be determined by your treatment team.  Patients discharged the day of surgery will not be allowed to drive home.   Name and phone number of your driver:   - Special instructions: Review  [place] - Preparing For Surgery.  Please read over the following fact sheets that you were given. Pain Booklet, Coughing and Deep Breathing and Surgical Site Infection Prevention

## 2014-09-13 NOTE — Progress Notes (Addendum)
Anesthesia Chart Review:  Pt is 44 year old female scheduled for L5-S1 lumbar laminectomy/decompression microdiscectomy on 09/16/2014 withDr. Lynann Bologna.   PMH includes: GERD. Never smoker. BMI 37. S/p hysteroscopy/D&C 10/21/12.   Preoperative labs reviewed.    Chest x-ray 09/09/2014 reviewed. There is no active cardiopulmonary disease.   EKG 09/09/2014: NSR. Cannot rule out Anterior infarct, age undetermined. Non-specific ST-t changes  Cardiac event monitor 11/5-12/07/2011 did not demonstrate any dysrhythmias.   Echo 01/29/2012: -Left ventricle: The cavity size was normal. Wall thickness was normal. Systolic function was normal. The estimated ejection fraction was in the range of 55% to 60%. Wall motion was normal; there were no regional wall motion abnormalities.  Reviewed EKG with Dr. Deatra Canter.   If no changes, I anticipate pt can proceed with surgery as scheduled.   Willeen Cass, FNP-BC [facility]/Anesthesiology Phone: [phone] 09/14/2014 10:09 AM

## 2014-09-15 MED ORDER — CEFAZOLIN SODIUM-DEXTROSE 2-3 GM-% IV SOLR
2.0000 g | INTRAVENOUS | Status: AC
  Administered 2014-09-16: 2 g via INTRAVENOUS
  Filled 2014-09-15 (×2): qty 50

## 2014-09-15 NOTE — Progress Notes (Signed)
Nurse called patient and informed her of time change and instructed her to arrive at 1020 instead of 1100. Patient verbalized understanding.

## 2014-09-16 ENCOUNTER — Encounter (HOSPITAL_COMMUNITY)
Admission: RE | Disposition: A | Discharge status: Home / Self Care | Payer: Self-pay | Source: Ambulatory Visit | Attending: Orthopedic Surgery

## 2014-09-16 ENCOUNTER — Ambulatory Visit (HOSPITAL_COMMUNITY)
Admission: RE | Admit: 2014-09-16 | Discharge: 2014-09-16 | Disposition: A | Discharge status: Home / Self Care | Payer: BLUE CROSS/BLUE SHIELD | Source: Ambulatory Visit | Attending: Orthopedic Surgery | Admitting: Orthopedic Surgery

## 2014-09-16 ENCOUNTER — Ambulatory Visit (HOSPITAL_COMMUNITY): Payer: BLUE CROSS/BLUE SHIELD

## 2014-09-16 ENCOUNTER — Ambulatory Visit (HOSPITAL_COMMUNITY): Payer: BLUE CROSS/BLUE SHIELD | Admitting: Anesthesiology

## 2014-09-16 ENCOUNTER — Ambulatory Visit (HOSPITAL_COMMUNITY): Payer: BLUE CROSS/BLUE SHIELD | Admitting: Emergency Medicine

## 2014-09-16 ENCOUNTER — Encounter (HOSPITAL_COMMUNITY): Payer: Self-pay | Admitting: *Deleted

## 2014-09-16 DIAGNOSIS — M1990 Unspecified osteoarthritis, unspecified site: Secondary | ICD-10-CM | POA: Insufficient documentation

## 2014-09-16 DIAGNOSIS — K219 Gastro-esophageal reflux disease without esophagitis: Secondary | ICD-10-CM | POA: Insufficient documentation

## 2014-09-16 DIAGNOSIS — M5127 Other intervertebral disc displacement, lumbosacral region: Secondary | ICD-10-CM | POA: Diagnosis present

## 2014-09-16 DIAGNOSIS — Z419 Encounter for procedure for purposes other than remedying health state, unspecified: Secondary | ICD-10-CM

## 2014-09-16 HISTORY — PX: LUMBAR LAMINECTOMY/DECOMPRESSION MICRODISCECTOMY: SHX5026

## 2014-09-16 SURGERY — LUMBAR LAMINECTOMY/DECOMPRESSION MICRODISCECTOMY
Anesthesia: General | Site: Back | Laterality: Left

## 2014-09-16 MED ORDER — BUPIVACAINE-EPINEPHRINE (PF) 0.25% -1:200000 IJ SOLN
INTRAMUSCULAR | Status: AC
  Filled 2014-09-16: qty 30

## 2014-09-16 MED ORDER — NEOSTIGMINE METHYLSULFATE 10 MG/10ML IV SOLN
INTRAVENOUS | Status: AC
  Filled 2014-09-16: qty 1

## 2014-09-16 MED ORDER — FENTANYL CITRATE (PF) 250 MCG/5ML IJ SOLN
INTRAMUSCULAR | Status: AC
  Filled 2014-09-16: qty 5

## 2014-09-16 MED ORDER — METHYLENE BLUE 1 % INJ SOLN
INTRAMUSCULAR | Status: DC | PRN
  Administered 2014-09-16: 1 mL

## 2014-09-16 MED ORDER — ONDANSETRON HCL 4 MG/2ML IJ SOLN
INTRAMUSCULAR | Status: AC
  Filled 2014-09-16: qty 2

## 2014-09-16 MED ORDER — DIAZEPAM 5 MG PO TABS
5.0000 mg | ORAL_TABLET | Freq: Once | ORAL | Status: AC
  Administered 2014-09-16: 5 mg via ORAL

## 2014-09-16 MED ORDER — BUPIVACAINE-EPINEPHRINE 0.25% -1:200000 IJ SOLN
INTRAMUSCULAR | Status: DC | PRN
  Administered 2014-09-16: 20 mL

## 2014-09-16 MED ORDER — MIDAZOLAM HCL 5 MG/5ML IJ SOLN
INTRAMUSCULAR | Status: DC | PRN
  Administered 2014-09-16: 1 mg via INTRAVENOUS

## 2014-09-16 MED ORDER — ONDANSETRON HCL 4 MG/2ML IJ SOLN
INTRAMUSCULAR | Status: DC | PRN
  Administered 2014-09-16 (×2): 4 mg via INTRAVENOUS

## 2014-09-16 MED ORDER — PROPOFOL 10 MG/ML IV BOLUS
INTRAVENOUS | Status: DC | PRN
  Administered 2014-09-16: 10 mg via INTRAVENOUS
  Administered 2014-09-16: 200 mg via INTRAVENOUS

## 2014-09-16 MED ORDER — NEOSTIGMINE METHYLSULFATE 10 MG/10ML IV SOLN
INTRAVENOUS | Status: DC | PRN
  Administered 2014-09-16: 4 mg via INTRAVENOUS

## 2014-09-16 MED ORDER — LIDOCAINE HCL (CARDIAC) 20 MG/ML IV SOLN
INTRAVENOUS | Status: DC | PRN
  Administered 2014-09-16: 60 mg via INTRAVENOUS

## 2014-09-16 MED ORDER — METHYLENE BLUE 1 % INJ SOLN
INTRAMUSCULAR | Status: AC
  Filled 2014-09-16: qty 10

## 2014-09-16 MED ORDER — LIDOCAINE HCL 4 % MT SOLN
OROMUCOSAL | Status: DC | PRN
  Administered 2014-09-16: 4 mL via TOPICAL

## 2014-09-16 MED ORDER — PROMETHAZINE HCL 25 MG/ML IJ SOLN
6.2500 mg | Freq: Once | INTRAMUSCULAR | Status: AC
  Administered 2014-09-16: 6.25 mg via INTRAVENOUS
  Filled 2014-09-16: qty 1

## 2014-09-16 MED ORDER — LACTATED RINGERS IV SOLN
INTRAVENOUS | Status: DC
  Administered 2014-09-16 (×2): via INTRAVENOUS

## 2014-09-16 MED ORDER — HYDROMORPHONE HCL 1 MG/ML IJ SOLN
0.5000 mg | INTRAMUSCULAR | Status: AC | PRN
  Administered 2014-09-16 (×4): 0.5 mg via INTRAVENOUS

## 2014-09-16 MED ORDER — GLYCOPYRROLATE 0.2 MG/ML IJ SOLN
INTRAMUSCULAR | Status: DC | PRN
  Administered 2014-09-16: .6 mg via INTRAVENOUS

## 2014-09-16 MED ORDER — THROMBIN 20000 UNITS EX SOLR
CUTANEOUS | Status: DC | PRN
  Administered 2014-09-16: 16:00:00 via TOPICAL

## 2014-09-16 MED ORDER — HYDROMORPHONE HCL 1 MG/ML IJ SOLN
INTRAMUSCULAR | Status: DC
Start: 2014-09-16 — End: 2014-09-16
  Filled 2014-09-16: qty 1

## 2014-09-16 MED ORDER — ROCURONIUM BROMIDE 50 MG/5ML IV SOLN
INTRAVENOUS | Status: AC
  Filled 2014-09-16: qty 1

## 2014-09-16 MED ORDER — LIDOCAINE HCL (CARDIAC) 20 MG/ML IV SOLN
INTRAVENOUS | Status: AC
  Filled 2014-09-16: qty 10

## 2014-09-16 MED ORDER — PROPOFOL 10 MG/ML IV BOLUS
INTRAVENOUS | Status: AC
  Filled 2014-09-16: qty 20

## 2014-09-16 MED ORDER — POVIDONE-IODINE 7.5 % EX SOLN
Freq: Once | CUTANEOUS | Status: DC
  Filled 2014-09-16: qty 118

## 2014-09-16 MED ORDER — ROCURONIUM BROMIDE 100 MG/10ML IV SOLN
INTRAVENOUS | Status: DC | PRN
Start: 2014-09-16 — End: 2014-09-16
  Administered 2014-09-16: 40 mg via INTRAVENOUS

## 2014-09-16 MED ORDER — DEXTROSE 5 % IV SOLN
INTRAVENOUS | Status: DC | PRN
  Administered 2014-09-16: 16:00:00 via INTRAVENOUS

## 2014-09-16 MED ORDER — METHYLPREDNISOLONE ACETATE 40 MG/ML IJ SUSP
INTRAMUSCULAR | Status: AC
  Filled 2014-09-16: qty 1

## 2014-09-16 MED ORDER — MIDAZOLAM HCL 2 MG/2ML IJ SOLN
INTRAMUSCULAR | Status: AC
  Filled 2014-09-16: qty 2

## 2014-09-16 MED ORDER — FENTANYL CITRATE (PF) 100 MCG/2ML IJ SOLN
INTRAMUSCULAR | Status: DC | PRN
  Administered 2014-09-16 (×2): 50 ug via INTRAVENOUS
  Administered 2014-09-16 (×2): 100 ug via INTRAVENOUS
  Administered 2014-09-16: 50 ug via INTRAVENOUS

## 2014-09-16 MED ORDER — ONDANSETRON HCL 4 MG/2ML IJ SOLN
4.0000 mg | Freq: Once | INTRAMUSCULAR | Status: AC | PRN
  Administered 2014-09-16: 4 mg via INTRAVENOUS

## 2014-09-16 MED ORDER — THROMBIN 20000 UNITS EX SOLR
CUTANEOUS | Status: AC
  Filled 2014-09-16: qty 20000

## 2014-09-16 MED ORDER — HEMOSTATIC AGENTS (NO CHARGE) OPTIME
TOPICAL | Status: DC | PRN
  Administered 2014-09-16: 1 via TOPICAL

## 2014-09-16 MED ORDER — METHYLPREDNISOLONE ACETATE 40 MG/ML IJ SUSP
INTRAMUSCULAR | Status: DC | PRN
  Administered 2014-09-16: 40 mg via INTRA_ARTICULAR

## 2014-09-16 MED ORDER — GLYCOPYRROLATE 0.2 MG/ML IJ SOLN
INTRAMUSCULAR | Status: AC
  Filled 2014-09-16: qty 3

## 2014-09-16 MED ORDER — DIAZEPAM 5 MG PO TABS
ORAL_TABLET | ORAL | Status: AC
  Filled 2014-09-16: qty 1

## 2014-09-16 MED ORDER — HYDROMORPHONE HCL 1 MG/ML IJ SOLN
INTRAMUSCULAR | Status: AC
  Filled 2014-09-16: qty 1

## 2014-09-16 SURGICAL SUPPLY — 71 items
APL SKNCLS STERI-STRIP NONHPOA (GAUZE/BANDAGES/DRESSINGS) ×1
BENZOIN TINCTURE PRP APPL 2/3 (GAUZE/BANDAGES/DRESSINGS) ×2 IMPLANT
BUR ROUND PRECISION 4.0 (BURR) ×2 IMPLANT
CANISTER SUCTION 2500CC (MISCELLANEOUS) ×2 IMPLANT
CARTRIDGE OIL MAESTRO DRILL (MISCELLANEOUS) ×1 IMPLANT
CLSR STERI-STRIP ANTIMIC 1/2X4 (GAUZE/BANDAGES/DRESSINGS) ×2 IMPLANT
CORDS BIPOLAR (ELECTRODE) ×2 IMPLANT
COVER SURGICAL LIGHT HANDLE (MISCELLANEOUS) ×2 IMPLANT
DIFFUSER DRILL AIR PNEUMATIC (MISCELLANEOUS) ×2 IMPLANT
DRAIN CHANNEL 15F RND FF W/TCR (WOUND CARE) IMPLANT
DRAPE POUCH INSTRU U-SHP 10X18 (DRAPES) ×4 IMPLANT
DRAPE SURG 17X23 STRL (DRAPES) ×8 IMPLANT
DURAPREP 26ML APPLICATOR (WOUND CARE) ×2 IMPLANT
ELECT BLADE 4.0 EZ CLEAN MEGAD (MISCELLANEOUS) ×2
ELECT CAUTERY BLADE 6.4 (BLADE) ×2 IMPLANT
ELECT REM PT RETURN 9FT ADLT (ELECTROSURGICAL) ×2
ELECTRODE BLDE 4.0 EZ CLN MEGD (MISCELLANEOUS) ×1 IMPLANT
ELECTRODE REM PT RTRN 9FT ADLT (ELECTROSURGICAL) ×1 IMPLANT
EVACUATOR SILICONE 100CC (DRAIN) IMPLANT
FILTER STRAW FLUID ASPIR (MISCELLANEOUS) ×2 IMPLANT
GAUZE SPONGE 4X4 12PLY STRL (GAUZE/BANDAGES/DRESSINGS) ×2 IMPLANT
GAUZE SPONGE 4X4 16PLY XRAY LF (GAUZE/BANDAGES/DRESSINGS) ×4 IMPLANT
GLOVE BIO SURGEON STRL SZ7 (GLOVE) ×2 IMPLANT
GLOVE BIO SURGEON STRL SZ8 (GLOVE) ×2 IMPLANT
GLOVE BIOGEL PI IND STRL 7.0 (GLOVE) ×1 IMPLANT
GLOVE BIOGEL PI IND STRL 8 (GLOVE) ×1 IMPLANT
GLOVE BIOGEL PI INDICATOR 7.0 (GLOVE) ×1
GLOVE BIOGEL PI INDICATOR 8 (GLOVE) ×1
GOWN STRL REUS W/ TWL LRG LVL3 (GOWN DISPOSABLE) ×1 IMPLANT
GOWN STRL REUS W/ TWL XL LVL3 (GOWN DISPOSABLE) ×2 IMPLANT
GOWN STRL REUS W/TWL LRG LVL3 (GOWN DISPOSABLE) ×2
GOWN STRL REUS W/TWL XL LVL3 (GOWN DISPOSABLE) ×4
IV CATH 14GX2 1/4 (CATHETERS) ×2 IMPLANT
KIT BASIN OR (CUSTOM PROCEDURE TRAY) ×2 IMPLANT
KIT POSITION SURG JACKSON T1 (MISCELLANEOUS) ×2 IMPLANT
KIT ROOM TURNOVER OR (KITS) ×2 IMPLANT
NEEDLE 18GX1X1/2 (RX/OR ONLY) (NEEDLE) ×2 IMPLANT
NEEDLE 22X1 1/2 (OR ONLY) (NEEDLE) ×2 IMPLANT
NEEDLE HYPO 25GX1X1/2 BEV (NEEDLE) ×2 IMPLANT
NEEDLE SPNL 18GX3.5 QUINCKE PK (NEEDLE) ×4 IMPLANT
NS IRRIG 1000ML POUR BTL (IV SOLUTION) ×2 IMPLANT
OIL CARTRIDGE MAESTRO DRILL (MISCELLANEOUS) ×2
PACK LAMINECTOMY ORTHO (CUSTOM PROCEDURE TRAY) ×2 IMPLANT
PACK UNIVERSAL I (CUSTOM PROCEDURE TRAY) ×2 IMPLANT
PAD ARMBOARD 7.5X6 YLW CONV (MISCELLANEOUS) ×4 IMPLANT
PATTIES SURGICAL .5 X.5 (GAUZE/BANDAGES/DRESSINGS) IMPLANT
PATTIES SURGICAL .5 X1 (DISPOSABLE) ×2 IMPLANT
SPONGE INTESTINAL PEANUT (DISPOSABLE) ×2 IMPLANT
SPONGE SURGIFOAM ABS GEL 100 (HEMOSTASIS) ×2 IMPLANT
SPONGE SURGIFOAM ABS GEL SZ50 (HEMOSTASIS) ×2 IMPLANT
STRIP CLOSURE SKIN 1/2X4 (GAUZE/BANDAGES/DRESSINGS) ×2 IMPLANT
SURGIFLO TRUKIT (HEMOSTASIS) IMPLANT
SURGIFLO W/THROMBIN 8M KIT (HEMOSTASIS) ×2 IMPLANT
SUT MNCRL AB 4-0 PS2 18 (SUTURE) ×2 IMPLANT
SUT VIC AB 0 CT1 18XCR BRD 8 (SUTURE) ×1 IMPLANT
SUT VIC AB 0 CT1 27 (SUTURE)
SUT VIC AB 0 CT1 27XBRD ANBCTR (SUTURE) IMPLANT
SUT VIC AB 0 CT1 8-18 (SUTURE) ×2
SUT VIC AB 1 CT1 18XCR BRD 8 (SUTURE) ×1 IMPLANT
SUT VIC AB 1 CT1 8-18 (SUTURE) ×2
SUT VIC AB 2-0 CT2 18 VCP726D (SUTURE) ×2 IMPLANT
SYR 20CC LL (SYRINGE) IMPLANT
SYR BULB IRRIGATION 50ML (SYRINGE) ×2 IMPLANT
SYR CONTROL 10ML LL (SYRINGE) ×4 IMPLANT
SYR TB 1ML 26GX3/8 SAFETY (SYRINGE) ×4 IMPLANT
SYR TB 1ML LUER SLIP (SYRINGE) ×4 IMPLANT
TAPE CLOTH SURG 4X10 WHT LF (GAUZE/BANDAGES/DRESSINGS) ×2 IMPLANT
TOWEL OR 17X24 6PK STRL BLUE (TOWEL DISPOSABLE) ×2 IMPLANT
TOWEL OR 17X26 10 PK STRL BLUE (TOWEL DISPOSABLE) ×2 IMPLANT
WATER STERILE IRR 1000ML POUR (IV SOLUTION) ×2 IMPLANT
YANKAUER SUCT BULB TIP NO VENT (SUCTIONS) ×2 IMPLANT

## 2014-09-16 NOTE — Anesthesia Postprocedure Evaluation (Signed)
  Anesthesia Post-op Note  Patient: Margaret Cruz  Procedure(s) Performed: Procedure(s) with comments: LUMBAR LAMINECTOMY/DECOMPRESSION MICRODISCECTOMY (Left) - Left sided lumbar 5- sacrum 1 microdisectomy   Patient Location: PACU  Anesthesia Type: General   Level of Consciousness: awake, alert  and oriented  Airway and Oxygen Therapy: Patient Spontanous Breathing  Post-op Pain: mild  Post-op Assessment: Post-op Vital signs reviewed  Post-op Vital Signs: Reviewed  Last Vitals:  Filed Vitals:   09/16/14 1845  BP: 122/72  Pulse:   Temp:   Resp:     Complications: No apparent anesthesia complications

## 2014-09-16 NOTE — Transfer of Care (Signed)
Immediate Anesthesia Transfer of Care Note  Patient: Margaret Cruz  Procedure(s) Performed: Procedure(s) with comments: LUMBAR LAMINECTOMY/DECOMPRESSION MICRODISCECTOMY (Left) - Left sided lumbar 5- sacrum 1 microdisectomy   Patient Location: PACU  Anesthesia Type:General  Level of Consciousness: awake, alert  and oriented  Airway & Oxygen Therapy: Patient Spontanous Breathing and Patient connected to face mask oxygen  Post-op Assessment: Report given to RN and Post -op Vital signs reviewed and stable  Post vital signs: Reviewed and stable  Last Vitals:  Filed Vitals:   09/16/14 1811  BP: 124/75  Pulse:   Temp: 36.7 C  Resp: 20    Complications: No apparent anesthesia complications

## 2014-09-16 NOTE — Anesthesia Procedure Notes (Signed)
Procedure Name: Intubation Date/Time: 09/16/2014 3:43 PM Performed by: Williemae Area B Pre-anesthesia Checklist: Patient identified, Patient being monitored, Emergency Drugs available and Suction available Patient Re-evaluated:Patient Re-evaluated prior to inductionOxygen Delivery Method: Circle system utilized Preoxygenation: Pre-oxygenation with 100% oxygen Intubation Type: IV induction Ventilation: Mask ventilation without difficulty Laryngoscope Size: Mac and 3 Grade View: Grade II Tube type: Oral Tube size: 7.5 mm Number of attempts: 1 Airway Equipment and Method: Stylet Placement Confirmation: ETT inserted through vocal cords under direct vision,  breath sounds checked- equal and bilateral and positive ETCO2 Secured at: 21 (cm at teeth) cm Tube secured with: Tape Dental Injury: Teeth and Oropharynx as per pre-operative assessment

## 2014-09-16 NOTE — Anesthesia Preprocedure Evaluation (Addendum)
Anesthesia Evaluation  Patient identified by MRN, date of birth, ID band Patient awake    Reviewed: Allergy & Precautions, NPO status , Patient's Chart, lab work & pertinent test results  History of Anesthesia Complications (+) PONV  Airway Mallampati: II  TM Distance: >3 FB Neck ROM: Full    Dental  (+) Teeth Intact, Dental Advisory Given   Pulmonary    Pulmonary exam normal       Cardiovascular Normal cardiovascular exam    Neuro/Psych  Headaches,    GI/Hepatic GERD-  Controlled and Medicated,  Endo/Other    Renal/GU      Musculoskeletal  (+) Arthritis -,   Abdominal   Peds  Hematology   Anesthesia Other Findings   Reproductive/Obstetrics                            Anesthesia Physical Anesthesia Plan  ASA: II  Anesthesia Plan: General   Post-op Pain Management:    Induction: Intravenous  Airway Management Planned: Oral ETT  Additional Equipment:   Intra-op Plan:   Post-operative Plan: Extubation in OR  Informed Consent: I have reviewed the patients History and Physical, chart, labs and discussed the procedure including the risks, benefits and alternatives for the proposed anesthesia with the patient or authorized representative who has indicated his/her understanding and acceptance.     Plan Discussed with: CRNA, Anesthesiologist and Surgeon  Anesthesia Plan Comments:         Anesthesia Quick Evaluation

## 2014-09-16 NOTE — H&P (Signed)
PREOPERATIVE H&P  Chief Complaint: left leg pain  HPI: Margaret Cruz is a 44 y.o. female who presents with ongoing pain in the left leg  MRI reveals left L5/S1 HNP  Patient has failed multiple forms of conservative care and continues to have pain (see office notes for additional details regarding the patient's full course of treatment)  Past Medical History  Diagnosis Date  . RLS (restless legs syndrome)   . Headache(784.0)   . Menorrhagia 09/26/2012  . Endometrial polyp 09/26/2012  . Overactive bladder   . PONV (postoperative nausea and vomiting)   . Family history of adverse reaction to anesthesia     MOther difficulty awaken  . Shortness of breath dyspnea     with exertion  . GERD (gastroesophageal reflux disease)     not all the time.  . IBS (irritable bowel syndrome)   . Arthritis    Past Surgical History  Procedure Laterality Date  . Cholecystectomy    . Tubal ligation    . Dilitation & currettage/hystroscopy with thermachoice ablation N/A 10/21/2012    Procedure: HYSTEROSCOPY, DILATATION & CURETTAGE (no tissue for pathology), THERMACHOICE ABLATION (Total Therapy Time=2min23sec);  Surgeon: Jonnie Kind, MD;  Location: [facility];  Service: Gynecology;  Laterality: N/A;  . Polypectomy N/A 10/21/2012    Procedure: REMOVAL OF ENDOMETRIAL POLYP;  Surgeon: Jonnie Kind, MD;  Location: [facility];  Service: Gynecology;  Laterality: N/A;   History   Social History  . Marital Status: Married    Spouse Name: N/A  . Number of Children: 4  . Years of Education: N/A   Occupational History  .  [employer]   Social History Main Topics  . Smoking status: Never Smoker   . Smokeless tobacco: Never Used  . Alcohol Use: No  . Drug Use: No  . Sexual Activity: Not Currently    Birth Control/ Protection: None   Other Topics Concern  . Not on file   Social History Narrative   Lives at home with husband and four children.    Pharmacy tech.    Family History  Problem  Relation Age of Onset  . CAD Father 27    CABG, stents  . Cancer Mother     Brain cancer resected  . Cancer Maternal Aunt     cervical   . Cancer Maternal Grandmother     cervical   . Heart disease Other   . Hypertension Other    No Known Allergies Prior to Admission medications   Medication Sig Start Date End Date Taking? Authorizing Provider  HYDROcodone-acetaminophen (NORCO/VICODIN) 5-325 MG per tablet Take 1 tablet by mouth every 6 (six) hours as needed for moderate pain.   Yes Historical Provider, MD  pramipexole (MIRAPEX) 1 MG tablet Take 1 tablet (1 mg total) by mouth at bedtime as needed. 07/05/14  Yes [name] Done, FNP     All other systems have been reviewed and were otherwise negative with the exception of those mentioned in the HPI and as above.  Physical Exam: There were no vitals filed for this visit.  General: Alert, no acute distress Cardiovascular: No pedal edema Respiratory: No cyanosis, no use of accessory musculature Skin: No lesions in the area of chief complaint Neurologic: Sensation intact distally Psychiatric: Patient is competent for consent with normal mood and affect Lymphatic: No axillary or cervical lymphadenopathy  MUSCULOSKELETAL: + SLR on left  Assessment/Plan: Left leg pain Plan for Procedure(s): LUMBAR LAMINECTOMY/DECOMPRESSION MICRODISCECTOMY (L5/S1)  Sinclair Ship, MD 09/16/2014 6:38 AM

## 2014-09-17 ENCOUNTER — Encounter (HOSPITAL_COMMUNITY): Payer: Self-pay | Admitting: Orthopedic Surgery

## 2014-09-17 NOTE — Op Note (Signed)
Margaret Cruz, Margaret Cruz              ACCOUNT NO.:  [id number]  MEDICAL RECORD NO.:  [id number]  LOCATION:  [facility]                         FACILITY:  [facility]  PHYSICIAN:  Phylliss Bob, MD      DATE OF BIRTH:  10-Sep-1970  DATE OF PROCEDURE:  09/16/2014 DATE OF DISCHARGE:  09/16/2014                              OPERATIVE REPORT   PREOPERATIVE DIAGNOSES: 1. Left-sided S1 radiculopathy. 2. Left-sided L5-S1 disk herniation.  POSTOPERATIVE DIAGNOSES: 1. Left-sided S1 radiculopathy. 2. Left-sided L5-S1 disk herniation.  PROCEDURE:  Left-sided L5-S1 microdiskectomy.  SURGEON:  Phylliss Bob, MD  ASSISTANT:  Pricilla Holm, PA-C.  ANESTHESIA:  General endotracheal anesthesia.  COMPLICATIONS:  None.  DISPOSITION:  Stable.  ESTIMATED BLOOD LOSS:  Minimal.  INDICATIONS FOR SURGERY:  Briefly, Margaret Cruz is a pleasant 44 year old female, who did present to me with severe debilitating pain in her left leg.  An MRI did reveal a large left L5-S1 disk herniation.  The patient failed conservative care, and we did discuss proceeding with the procedure reflected above.  The patient did elect to proceed.  OPERATIVE DETAILS:  On September 16, 2014, the patient was brought to Surgery and general endotracheal anesthesia was administered.  The patient was placed prone on a well-padded flat Jackson bed with a spinal frame. Antibiotics were given.  The back was prepped and draped and a time-out procedure was performed.  A midline incision was made overlying the L5- S1 intervertebral space.  A curvilinear incision was made to the left of the midline of the fascia.  The paraspinal musculature was retracted and a self-retaining McCullough retractor was placed.  I did use a high- speed bur to remove the medial and inferior aspect of the L5 lamina. The ligamentum flavum was identified and removed.  The traversing S1 nerve was identified and medially retracted.  A large L5-S1 disk herniation was readily  apparent.  I did tease away the superficial layers overlying the herniation.  Various fragments of disk were removed uneventfully.  I did continue to do compression on the nerve and I continued to hold medial retraction of the nerve and I proceeded with an annulotomy.  A very large portion of the annulus and disk herniation was removed and a large fragment, thereby completely decompressing the traversing S1 nerve.  I was very pleased with the decompression. Bleeding was controlled using Surgiflo.  40 mg of Depo-Medrol was introduced about the epidural space.  The wound was copiously irrigated. The wound was closed in layers using #1 Vicryl followed by 2-0 Vicryl, followed by 3-0 Monocryl.  Benzoin and Steri-Strips were applied followed by sterile dressing.  All instrument counts were correct at the termination of the procedure.  Of note, Pricilla Holm was my assistant throughout the surgery, and did aid in retraction, suctioning, and closure.     Phylliss Bob, MD     MD/MEDQ  D:  09/16/2014  T:  09/17/2014  Job:  [id number]

## 2014-10-04 ENCOUNTER — Other Ambulatory Visit: Payer: Self-pay | Admitting: Nurse Practitioner

## 2014-10-04 MED ORDER — PRAMIPEXOLE DIHYDROCHLORIDE 1 MG PO TABS: 1.0000 mg | ORAL_TABLET | Freq: Every evening | ORAL | Status: DC | PRN

## 2014-10-04 NOTE — Telephone Encounter (Signed)
Last office visit with Margaret Cruz on 10-05-2013.  Last mirapex refill done 07-05-2014 for qty 90.  If refilled send to [place], [address].

## 2014-10-04 NOTE — Telephone Encounter (Signed)
no more refills without being seen  

## 2014-11-15 HISTORY — PX: HIP SURGERY: SHX245

## 2014-11-30 ENCOUNTER — Telehealth: Payer: Self-pay | Admitting: *Deleted

## 2014-11-30 NOTE — Telephone Encounter (Signed)
I called and let Dr. Edd Arbour know that forwarded message to Chevis Pretty, [place] who is pcp for pt ( and that we have not seen pt).

## 2014-11-30 NOTE — Telephone Encounter (Signed)
Dr. Edd Arbour called re: pt.  Wanted to speak with you.  [phone].

## 2014-11-30 NOTE — Telephone Encounter (Signed)
This patient has not been seen in our office. Her PCP is Chevis Pretty according to [facility]

## 2014-12-03 ENCOUNTER — Telehealth: Payer: Self-pay | Admitting: *Deleted
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
## 2014-12-06 ENCOUNTER — Telehealth: Payer: Self-pay | Admitting: Nurse Practitioner

## 2014-12-06 ENCOUNTER — Ambulatory Visit: Payer: Self-pay | Admitting: Nurse Practitioner

## 2014-12-06 NOTE — Telephone Encounter (Signed)
Informed pt that the nurse at the hospital & I had cancelled today's appt & recscheduled for next Monday 8/29. Gave pt phone # to [facility] to help with transportation. Will keep appt for Monday for now until home health comes out this week for evaluation

## 2014-12-13 ENCOUNTER — Ambulatory Visit: Payer: Self-pay | Admitting: Nurse Practitioner

## 2014-12-14 ENCOUNTER — Ambulatory Visit: Payer: Self-pay | Admitting: Nurse Practitioner

## 2014-12-16 ENCOUNTER — Other Ambulatory Visit: Payer: Self-pay | Admitting: Orthopedic Surgery

## 2014-12-16 DIAGNOSIS — T148XXA Other injury of unspecified body region, initial encounter: Secondary | ICD-10-CM

## 2014-12-21 ENCOUNTER — Other Ambulatory Visit: Payer: Self-pay | Admitting: Nurse Practitioner

## 2014-12-21 ENCOUNTER — Ambulatory Visit
Admission: RE | Admit: 2014-12-21 | Discharge: 2014-12-21 | Disposition: A | Discharge status: Home / Self Care | Payer: BLUE CROSS/BLUE SHIELD | Source: Ambulatory Visit | Attending: Orthopedic Surgery | Admitting: Orthopedic Surgery

## 2014-12-21 DIAGNOSIS — T148XXA Other injury of unspecified body region, initial encounter: Secondary | ICD-10-CM

## 2014-12-21 MED ORDER — PRAMIPEXOLE DIHYDROCHLORIDE 1 MG PO TABS: 1.0000 mg | ORAL_TABLET | Freq: Every evening | ORAL | Status: DC | PRN

## 2014-12-21 MED ORDER — HYDROCODONE-ACETAMINOPHEN 5-325 MG PO TABS: 1.0000 | ORAL_TABLET | Freq: Four times a day (QID) | ORAL | Status: DC | PRN

## 2014-12-21 MED ORDER — FUROSEMIDE 20 MG PO TABS: 20.0000 mg | ORAL_TABLET | Freq: Every day | ORAL | Status: DC

## 2014-12-28 ENCOUNTER — Ambulatory Visit: Payer: BLUE CROSS/BLUE SHIELD | Admitting: Nurse Practitioner

## 2014-12-28 ENCOUNTER — Encounter: Payer: Self-pay | Admitting: Nurse Practitioner

## 2014-12-28 VITALS — BP 128/82 | HR 88 | Temp 97.6°F

## 2014-12-28 DIAGNOSIS — Z9889 Other specified postprocedural states: Secondary | ICD-10-CM | POA: Diagnosis not present

## 2014-12-28 DIAGNOSIS — Z09 Encounter for follow-up examination after completed treatment for conditions other than malignant neoplasm: Secondary | ICD-10-CM

## 2014-12-28 DIAGNOSIS — S92001K Unspecified fracture of right calcaneus, subsequent encounter for fracture with nonunion: Secondary | ICD-10-CM | POA: Diagnosis not present

## 2014-12-28 DIAGNOSIS — Z8781 Personal history of (healed) traumatic fracture: Secondary | ICD-10-CM
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
## 2014-12-29 ENCOUNTER — Ambulatory Visit: Payer: BLUE CROSS/BLUE SHIELD | Admitting: Nurse Practitioner

## 2015-01-14 ENCOUNTER — Encounter (INDEPENDENT_AMBULATORY_CARE_PROVIDER_SITE_OTHER): Payer: BLUE CROSS/BLUE SHIELD | Admitting: Family Medicine

## 2015-01-14 DIAGNOSIS — S32441D Displaced fracture of posterior column [ilioischial] of right acetabulum, subsequent encounter for fracture with routine healing: Secondary | ICD-10-CM

## 2015-01-14 DIAGNOSIS — S92001D Unspecified fracture of right calcaneus, subsequent encounter for fracture with routine healing: Secondary | ICD-10-CM | POA: Diagnosis not present

## 2015-01-14 DIAGNOSIS — S92201D Fracture of unspecified tarsal bone(s) of right foot, subsequent encounter for fracture with routine healing: Secondary | ICD-10-CM | POA: Diagnosis not present

## 2015-01-14 DIAGNOSIS — S32413D Displaced fracture of anterior wall of unspecified acetabulum, subsequent encounter for fracture with routine healing: Secondary | ICD-10-CM

## 2015-01-18 ENCOUNTER — Telehealth: Payer: Self-pay | Admitting: Nurse Practitioner

## 2015-01-18 MED ORDER — ZOLPIDEM TARTRATE 5 MG PO TABS: 5.0000 mg | ORAL_TABLET | Freq: Every evening | ORAL | Status: DC | PRN

## 2015-01-18 NOTE — Telephone Encounter (Signed)
Patient aware that she needs to wait until she is getting around better with injured heel.  Also, to wait 6 months per Chevis Pretty, FNP.  Patient verbalized understanding.
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
## 2015-01-31 ENCOUNTER — Other Ambulatory Visit: Payer: Self-pay | Admitting: Nurse Practitioner

## 2015-01-31 MED ORDER — HYDROCODONE-ACETAMINOPHEN 5-325 MG PO TABS: 1.0000 | ORAL_TABLET | Freq: Four times a day (QID) | ORAL | Status: DC | PRN

## 2015-01-31 NOTE — Telephone Encounter (Signed)
Hydrocodone rx ready for pick up Try to cut back on pain meds if possible

## 2015-01-31 NOTE — Telephone Encounter (Signed)
Patient aware that rx is ready to be picked up.  

## 2015-02-01 ENCOUNTER — Ambulatory Visit: Payer: BLUE CROSS/BLUE SHIELD | Attending: Orthopedic Surgery | Admitting: Physical Therapy

## 2015-02-01 DIAGNOSIS — M25551 Pain in right hip: Secondary | ICD-10-CM

## 2015-02-01 DIAGNOSIS — M25471 Effusion, right ankle: Secondary | ICD-10-CM | POA: Diagnosis present

## 2015-02-01 DIAGNOSIS — M25651 Stiffness of right hip, not elsewhere classified: Secondary | ICD-10-CM | POA: Insufficient documentation

## 2015-02-01 DIAGNOSIS — R531 Weakness: Secondary | ICD-10-CM | POA: Insufficient documentation

## 2015-02-01 DIAGNOSIS — M25671 Stiffness of right ankle, not elsewhere classified: Secondary | ICD-10-CM | POA: Diagnosis present

## 2015-02-01 DIAGNOSIS — R269 Unspecified abnormalities of gait and mobility: Secondary | ICD-10-CM

## 2015-02-01 NOTE — Patient Instructions (Signed)
Gastroc / Heel Cord Stretch - Seated With Towel   Sit on floor, towel around ball of foot. Gently pull foot in toward body, stretching heel cord and calf. Hold for _30__ seconds. Repeat _3__ times. Do 3-4___ times per day.   Ankle Alphabet   Using left ankle and foot only, trace the letters of the alphabet. Perform A to Z. Repeat _1___ times per set. Do ____ sets per session. Do __2-3__ sessions per day.  http://orth.exer.us/16   Copyright  VHI. All rights reserved.    Ankle Circles   Slowly rotate right foot and ankle clockwise then counterclockwise. Gradually increase range of motion. Avoid pain. Circle __20__ times each direction per set. Do ____ sets per session. Do 2-3____ sessions per day.  http://orth.exer.us/30   Copyright  VHI. All rights reserved.    ROM: Plantar / Dorsiflexion   With left leg relaxed, gently flex and extend ankle. Move through full range of motion. Avoid pain. Repeat __20__ times per set. Do ____ sets per session. Do __3-4__ sessions per day.  http://orth.exer.us/34   Copyright  VHI. All rights reserved.    ROM: Inversion / Eversion   With left leg relaxed, gently turn ankle and foot in and out. Move through full range of motion. Avoid pain. Repeat _20___ times per set. Do ____ sets per session. Do _3-4___ sessions per day.  http://orth.exer.us/36   Copyright  VHI. All rights reserved.    Stretching: Piriformis (Supine)  Pull right knee toward opposite shoulder. Hold _30___ seconds. Relax. Repeat _3___ times per set. Do ____ sets per session. Do __2-3__ sessions per day.  Madelyn Flavors, PT 02/01/2015 10:40 AM;[facility] [place], [place], [address] Phone: [phone]   Fax:  [phone]
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
## 2015-02-03 ENCOUNTER — Ambulatory Visit: Payer: BLUE CROSS/BLUE SHIELD | Admitting: Physical Therapy

## 2015-02-03 ENCOUNTER — Ambulatory Visit (INDEPENDENT_AMBULATORY_CARE_PROVIDER_SITE_OTHER): Payer: BLUE CROSS/BLUE SHIELD | Admitting: Nurse Practitioner

## 2015-02-03 ENCOUNTER — Encounter: Payer: Self-pay | Admitting: Nurse Practitioner

## 2015-02-03 VITALS — BP 120/74 | HR 70 | Temp 97.4°F

## 2015-02-03 DIAGNOSIS — R1011 Right upper quadrant pain: Secondary | ICD-10-CM | POA: Diagnosis not present
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
## 2015-02-07 ENCOUNTER — Ambulatory Visit (HOSPITAL_COMMUNITY)
Admission: RE | Admit: 2015-02-07 | Discharge: 2015-02-07 | Disposition: A | Discharge status: Home / Self Care | Payer: BLUE CROSS/BLUE SHIELD | Source: Ambulatory Visit | Attending: Nurse Practitioner | Admitting: Nurse Practitioner

## 2015-02-07 DIAGNOSIS — N281 Cyst of kidney, acquired: Secondary | ICD-10-CM | POA: Diagnosis not present

## 2015-02-07 DIAGNOSIS — Z9049 Acquired absence of other specified parts of digestive tract: Secondary | ICD-10-CM | POA: Diagnosis not present

## 2015-02-07 DIAGNOSIS — R932 Abnormal findings on diagnostic imaging of liver and biliary tract: Secondary | ICD-10-CM | POA: Diagnosis not present

## 2015-02-07 DIAGNOSIS — R109 Unspecified abdominal pain: Secondary | ICD-10-CM | POA: Diagnosis present

## 2015-02-07 DIAGNOSIS — R1011 Right upper quadrant pain: Secondary | ICD-10-CM

## 2015-02-08 ENCOUNTER — Other Ambulatory Visit: Payer: Self-pay | Admitting: Nurse Practitioner

## 2015-02-08 DIAGNOSIS — R1900 Intra-abdominal and pelvic swelling, mass and lump, unspecified site: Secondary | ICD-10-CM

## 2015-02-11 ENCOUNTER — Ambulatory Visit (HOSPITAL_COMMUNITY)
Admission: RE | Admit: 2015-02-11 | Discharge: 2015-02-11 | Disposition: A | Discharge status: Home / Self Care | Payer: BLUE CROSS/BLUE SHIELD | Source: Ambulatory Visit | Attending: Nurse Practitioner | Admitting: Nurse Practitioner

## 2015-02-11 DIAGNOSIS — K439 Ventral hernia without obstruction or gangrene: Secondary | ICD-10-CM | POA: Insufficient documentation

## 2015-02-11 DIAGNOSIS — R1900 Intra-abdominal and pelvic swelling, mass and lump, unspecified site: Secondary | ICD-10-CM

## 2015-02-11 DIAGNOSIS — R1903 Right lower quadrant abdominal swelling, mass and lump: Secondary | ICD-10-CM | POA: Diagnosis present

## 2015-02-11 MED ORDER — IOHEXOL 300 MG/ML  SOLN
100.0000 mL | Freq: Once | INTRAMUSCULAR | Status: AC | PRN
  Administered 2015-02-11: 100 mL via INTRAVENOUS

## 2015-02-14 ENCOUNTER — Other Ambulatory Visit: Payer: Self-pay | Admitting: Nurse Practitioner

## 2015-02-14 DIAGNOSIS — K439 Ventral hernia without obstruction or gangrene: Secondary | ICD-10-CM

## 2015-02-23 ENCOUNTER — Encounter: Payer: Self-pay | Admitting: Nurse Practitioner

## 2015-02-25 ENCOUNTER — Ambulatory Visit: Payer: Self-pay | Admitting: Surgery

## 2015-03-01 ENCOUNTER — Ambulatory Visit: Payer: BLUE CROSS/BLUE SHIELD | Attending: Orthopedic Surgery | Admitting: Physical Therapy

## 2015-03-01 DIAGNOSIS — M25651 Stiffness of right hip, not elsewhere classified: Secondary | ICD-10-CM | POA: Diagnosis present

## 2015-03-01 DIAGNOSIS — M25671 Stiffness of right ankle, not elsewhere classified: Secondary | ICD-10-CM | POA: Diagnosis present

## 2015-03-01 DIAGNOSIS — M25471 Effusion, right ankle: Secondary | ICD-10-CM | POA: Diagnosis present

## 2015-03-01 DIAGNOSIS — M25551 Pain in right hip: Secondary | ICD-10-CM

## 2015-03-01 DIAGNOSIS — R269 Unspecified abnormalities of gait and mobility: Secondary | ICD-10-CM

## 2015-03-01 DIAGNOSIS — R531 Weakness: Secondary | ICD-10-CM | POA: Insufficient documentation
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
## 2015-03-03 ENCOUNTER — Ambulatory Visit: Payer: BLUE CROSS/BLUE SHIELD | Admitting: Physical Therapy

## 2015-03-03 ENCOUNTER — Telehealth: Payer: Self-pay | Admitting: Nurse Practitioner

## 2015-03-03 ENCOUNTER — Other Ambulatory Visit: Payer: Self-pay | Admitting: Nurse Practitioner

## 2015-03-03 DIAGNOSIS — M25471 Effusion, right ankle: Secondary | ICD-10-CM

## 2015-03-03 DIAGNOSIS — M25551 Pain in right hip: Secondary | ICD-10-CM | POA: Diagnosis not present

## 2015-03-03 DIAGNOSIS — R269 Unspecified abnormalities of gait and mobility: Secondary | ICD-10-CM

## 2015-03-03 DIAGNOSIS — M25651 Stiffness of right hip, not elsewhere classified: Secondary | ICD-10-CM

## 2015-03-03 DIAGNOSIS — M25671 Stiffness of right ankle, not elsewhere classified: Secondary | ICD-10-CM

## 2015-03-03 DIAGNOSIS — R531 Weakness: Secondary | ICD-10-CM

## 2015-03-03 MED ORDER — PRAMIPEXOLE DIHYDROCHLORIDE 1.5 MG PO TABS: 1.5000 mg | ORAL_TABLET | Freq: Three times a day (TID) | ORAL | Status: DC

## 2015-03-03 MED ORDER — PRAMIPEXOLE DIHYDROCHLORIDE 1 MG PO TABS: 1.0000 mg | ORAL_TABLET | Freq: Every evening | ORAL | Status: DC | PRN

## 2015-03-03 NOTE — Telephone Encounter (Signed)
Changed mirapex to 1.5mg  daily

## 2015-03-03 NOTE — Therapy (Signed)
[facility] [place], [place], [address] Phone: [phone]   Fax:  [phone]  Physical Therapy Treatment  Patient Details  Name: Margaret Cruz MRN: [id number] Date of Birth: Aug 27, 1970 Referring Provider: [facility] (Dr. Doran Durand)  Encounter Date: 03/03/2015      PT End of Session - 03/03/15 0949    Visit Number 3   Number of Visits 21   Date for PT Re-Evaluation 04/14/15   PT Start Time 0949   PT Stop Time 1031   PT Time Calculation (min) 42 min   Activity Tolerance Patient tolerated treatment well   Behavior During Therapy [facility] for tasks assessed/performed      Past Medical History  Diagnosis Date  . RLS (restless legs syndrome)   . Headache(784.0)   . Menorrhagia 09/26/2012  . Endometrial polyp 09/26/2012  . Overactive bladder   . PONV (postoperative nausea and vomiting)   . Family history of adverse reaction to anesthesia     MOther difficulty awaken  . Shortness of breath dyspnea     with exertion  . GERD (gastroesophageal reflux disease)     not all the time.  . IBS (irritable bowel syndrome)   . Arthritis     Past Surgical History  Procedure Laterality Date  . Cholecystectomy    . Tubal ligation    . Dilitation & currettage/hystroscopy with thermachoice ablation N/A 10/21/2012    Procedure: HYSTEROSCOPY, DILATATION & CURETTAGE (no tissue for pathology), THERMACHOICE ABLATION (Total Therapy Time=35min23sec);  Surgeon: Jonnie Kind, MD;  [facility]: [facility];  Service: Gynecology;  Laterality: N/A;  . Polypectomy N/A 10/21/2012    Procedure: REMOVAL OF ENDOMETRIAL POLYP;  Surgeon: Jonnie Kind, MD;  Location: [facility];  Service: Gynecology;  Laterality: N/A;  . Lumbar laminectomy/decompression microdiscectomy Left 09/16/2014    Procedure: LUMBAR LAMINECTOMY/DECOMPRESSION MICRODISCECTOMY;  Surgeon: Phylliss Bob, MD;  Location: [facility];  Service: Orthopedics;  Laterality: Left;  Left sided lumbar 5-  sacrum 1 microdisectomy     There were no vitals filed for this visit.  Visit Diagnosis:  Right hip pain - Plan: PT plan of care cert/re-cert  Stiffness of right hip joint - Plan: PT plan of care cert/re-cert  Stiffness of right ankle joint - Plan: PT plan of care cert/re-cert  Swelling of right ankle joint - Plan: PT plan of care cert/re-cert  Generalized weakness - Plan: PT plan of care cert/re-cert  Abnormality of gait - Plan: PT plan of care cert/re-cert      Subjective Assessment - 03/03/15 0954    Subjective Since her last visit, the patient has had increased pain in R hip from aggravated restless leg syndrome.    Patient Stated Goals lay in a bed, and walk normally again.   Currently in Pain? Yes   Pain Score 2    Pain Location Ankle   Pain Orientation Right   Pain Descriptors / Indicators Aching;Burning   Pain Onset More than a month ago   Pain Frequency Intermittent   Aggravating Factors  prolonged walking   Pain Relieving Factors elevation   Effect of Pain on Daily Activities limited   Multiple Pain Sites Yes   Pain Score 3   Pain Location Hip   Pain Orientation Right   Pain Descriptors / Indicators Aching   Pain Onset More than a month ago   Pain Frequency Constant   Aggravating Factors  sitting to standing   Pain Relieving Factors nothing  [facility] PT Assessment - 03/03/15 0001    Assessment   Medical Diagnosis s/p fx R acetabulum, med malleolus, calcaneus and 5th phalanx   Referring Provider [facility] (Dr. Doran Durand)   Next MD Visit 04/06/15   Precautions   Precautions Back  lumbar discectomy 09/16/14   Precaution Comments FWB   Other Brace/Splint none   Restrictions   Weight Bearing Restrictions No   Balance Screen   Has the patient fallen in the past 6 months No   Has the patient had a decrease in activity level because of a fear of falling?  No   Is the patient reluctant to leave their home because of a fear of falling?  No    Prior Function   Level of Independence Independent with basic ADLs   Observation/Other Assessments-Edema    Edema Circumferential  54.5 cm   ROM / Strength   AROM / PROM / Strength AROM;Strength   AROM   Right Ankle Dorsiflexion -7  0   Right Ankle Plantar Flexion 62  70   Right Ankle Inversion 18  28   Right Ankle Eversion 12  25   Strength   Strength Assessment Site Hip;Knee;Ankle   Right Hip Flexion 4-/5   Right Hip ABduction 4-/5   Right/Left Ankle Right   Right Ankle Plantar Flexion 5/5   Right Ankle Inversion 4/5   Right Ankle Eversion 3+/5   Flexibility   Soft Tissue Assessment /Muscle Length --  tight R hip rotators and HS   Palpation   Palpation comment marked tenderness of R lateral malleolus, plantar fascia, peroneals, post tib, ITB, hip ext rotators   Ambulation/Gait   Ambulation Distance (Feet) 30 Feet   Assistive device Crutches   Gait Pattern Decreased step length - left;Decreased stance time - right;Decreased hip/knee flexion - right;Decreased weight shift to right   Ambulation Surface Level   Gait Comments Patient takes large step length on R                     [facility] Adult PT Treatment/Exercise - 03/03/15 0001    Ambulation/Gait   Pre-Gait Activities weight shifting, semitandem stance weight shift B   Exercises   Exercises Knee/Hip;Ankle   Knee/Hip Exercises: Aerobic   Nustep L3 x 10 min   for hip ROM/strenthening   Ankle Exercises: Stretches   Gastroc Stretch 1 rep;30 seconds                PT Education - 03/03/15 1603    Education provided Yes   Education Details HEP for gait, weight shifting; runners stretch, hip extensor stretch, and Plantar fascia   Person(s) Educated Patient   Methods Explanation;Demonstration;Handout   Comprehension Verbalized understanding;Returned demonstration          PT Short Term Goals - 03/03/15 1311    PT SHORT TERM GOAL #1   Title I with initial HEP 02/15/15   Time 2   Period Weeks    Status Achieved   PT SHORT TERM GOAL #2   Title able to amb safely with RW and PWB 200 ft   Time 2   Status Achieved           PT Long Term Goals - 03/03/15 1311    PT LONG TERM GOAL #1   Time 6   Period Weeks   Status On-going   PT LONG TERM GOAL #2   Title decreased hip pain to 2/10 overall   Time 6  Period Weeks   Status On-going   PT LONG TERM GOAL #3   Title patient able to sleep in bed with minimal discomfort   Status Achieved   PT LONG TERM GOAL #4   Title demo 4+/5 RLE strength to improve function   Time 6   Period Weeks   Status On-going   PT LONG TERM GOAL #5   Title decreased R ankle edema to within 2.5 cm of L ankle   Time 6   Period Weeks   Status On-going   Additional Long Term Goals   Additional Long Term Goals Yes   PT LONG TERM GOAL #6   Title Patient able to ambulate safely on level surfaces without AD   Time 6   Period Weeks   Status New               Plan - 03/03/15 1045    Clinical Impression Statement Patient has made significant improvements in R ankle ROM but is still limted with ROM and strength in the RLE overall. Her R hip has been cleared by Dr. Alvan Dame. She is FWB with B crutches and would like to wean from AD as soon as possible in order to have hernia surgery. She also has marked tenderness of R plantar fascia.   Pt will benefit from skilled therapeutic intervention in order to improve on the following deficits Decreased range of motion;Difficulty walking;Abnormal gait;Pain;Decreased activity tolerance;Decreased balance;Decreased mobility;Decreased strength;Increased edema   Rehab Potential Excellent   PT Frequency 3x / week   PT Duration 6 weeks   PT Treatment/Interventions ADLs/Self Care Home Management;Electrical Stimulation;Cryotherapy;Moist Heat;Therapeutic exercise;Stair training;Gait training;Ultrasound;Neuromuscular re-education;Balance training;Patient/family education;Manual techniques;Vasopneumatic  Device;Taping;Passive range of motion;Scar mobilization   PT Next Visit Plan Nustep for hip flexibility and strength, [place] to plantar fascia with STW, gait training with one axillary crutch progressing to no AD.   PT Home Exercise Plan runners stretch, plantar fascia    Consulted and Agree with Plan of Care Patient        Problem List Patient Active Problem List   Diagnosis Date Noted  . Status post-operative repair of hip fracture 12/28/2014  . Folliculitis of mons  pubis. 11/03/2012  . Migraines 08/06/2012  . Precordial pain 02/08/2012  . Palpitations 02/08/2012   Madelyn Flavors PT  03/03/2015, 4:22 PM  [facility] [place], [place], [address] Phone: [phone]   Fax:  [phone]  Name: Margaret Cruz MRN: [id number] Date of Birth: 01-10-1971

## 2015-03-04 NOTE — Telephone Encounter (Signed)
Patient aware.

## 2015-03-07 ENCOUNTER — Encounter: Payer: Self-pay | Admitting: *Deleted

## 2015-03-07 ENCOUNTER — Ambulatory Visit: Payer: BLUE CROSS/BLUE SHIELD | Admitting: *Deleted

## 2015-03-07 DIAGNOSIS — M25471 Effusion, right ankle: Secondary | ICD-10-CM

## 2015-03-07 DIAGNOSIS — M25551 Pain in right hip: Secondary | ICD-10-CM

## 2015-03-07 DIAGNOSIS — M25651 Stiffness of right hip, not elsewhere classified: Secondary | ICD-10-CM

## 2015-03-07 DIAGNOSIS — M25671 Stiffness of right ankle, not elsewhere classified: Secondary | ICD-10-CM

## 2015-03-07 DIAGNOSIS — R531 Weakness: Secondary | ICD-10-CM

## 2015-03-07 DIAGNOSIS — R269 Unspecified abnormalities of gait and mobility: Secondary | ICD-10-CM
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
## 2015-03-09 ENCOUNTER — Encounter: Payer: Self-pay | Admitting: Physical Therapy

## 2015-03-09 ENCOUNTER — Ambulatory Visit: Payer: BLUE CROSS/BLUE SHIELD | Admitting: Physical Therapy

## 2015-03-09 DIAGNOSIS — M25651 Stiffness of right hip, not elsewhere classified: Secondary | ICD-10-CM

## 2015-03-09 DIAGNOSIS — R531 Weakness: Secondary | ICD-10-CM

## 2015-03-09 DIAGNOSIS — M25551 Pain in right hip: Secondary | ICD-10-CM

## 2015-03-09 DIAGNOSIS — R269 Unspecified abnormalities of gait and mobility: Secondary | ICD-10-CM

## 2015-03-09 DIAGNOSIS — M25671 Stiffness of right ankle, not elsewhere classified: Secondary | ICD-10-CM

## 2015-03-09 DIAGNOSIS — M25471 Effusion, right ankle: Secondary | ICD-10-CM
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
## 2015-03-15 ENCOUNTER — Ambulatory Visit: Payer: BLUE CROSS/BLUE SHIELD | Admitting: Physical Therapy

## 2015-03-15 ENCOUNTER — Encounter: Payer: Self-pay | Admitting: Physical Therapy

## 2015-03-15 DIAGNOSIS — M25551 Pain in right hip: Secondary | ICD-10-CM

## 2015-03-15 DIAGNOSIS — M25671 Stiffness of right ankle, not elsewhere classified: Secondary | ICD-10-CM

## 2015-03-15 DIAGNOSIS — R531 Weakness: Secondary | ICD-10-CM

## 2015-03-15 DIAGNOSIS — M25651 Stiffness of right hip, not elsewhere classified: Secondary | ICD-10-CM

## 2015-03-15 DIAGNOSIS — M25471 Effusion, right ankle: Secondary | ICD-10-CM

## 2015-03-15 DIAGNOSIS — R269 Unspecified abnormalities of gait and mobility: Secondary | ICD-10-CM
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
## 2015-03-17 ENCOUNTER — Ambulatory Visit: Payer: BLUE CROSS/BLUE SHIELD | Attending: Orthopedic Surgery | Admitting: Physical Therapy

## 2015-03-17 DIAGNOSIS — M25551 Pain in right hip: Secondary | ICD-10-CM | POA: Diagnosis present

## 2015-03-17 DIAGNOSIS — M25471 Effusion, right ankle: Secondary | ICD-10-CM | POA: Diagnosis present

## 2015-03-17 DIAGNOSIS — M25671 Stiffness of right ankle, not elsewhere classified: Secondary | ICD-10-CM

## 2015-03-17 DIAGNOSIS — M25651 Stiffness of right hip, not elsewhere classified: Secondary | ICD-10-CM | POA: Insufficient documentation

## 2015-03-17 DIAGNOSIS — R269 Unspecified abnormalities of gait and mobility: Secondary | ICD-10-CM | POA: Diagnosis present

## 2015-03-17 DIAGNOSIS — R531 Weakness: Secondary | ICD-10-CM

## 2015-03-17 NOTE — Therapy (Signed)
[facility] [place], [place], [address] Phone: [phone]   Fax:  [phone]  Physical Therapy Treatment  Patient Details  Name: FELISE MCELRATH MRN: [id number] Date of Birth: 04/27/1970 Referring Provider: Dorma Russell [facility] (Dr. Doran Durand)  Encounter Date: 03/17/2015      PT End of Session - 03/17/15 0859    Visit Number 7   Number of Visits 21   Date for PT Re-Evaluation 04/14/15   PT Start Time [id number]   PT Stop Time 0951   PT Time Calculation (min) 54 min   Activity Tolerance Patient tolerated treatment well   Behavior During Therapy [facility] for tasks assessed/performed      Past Medical History  Diagnosis Date  . RLS (restless legs syndrome)   . Headache(784.0)   . Menorrhagia 09/26/2012  . Endometrial polyp 09/26/2012  . Overactive bladder   . PONV (postoperative nausea and vomiting)   . Family history of adverse reaction to anesthesia     MOther difficulty awaken  . Shortness of breath dyspnea     with exertion  . GERD (gastroesophageal reflux disease)     not all the time.  . IBS (irritable bowel syndrome)   . Arthritis     Past Surgical History  Procedure Laterality Date  . Cholecystectomy    . Tubal ligation    . Dilitation & currettage/hystroscopy with thermachoice ablation N/A 10/21/2012    Procedure: HYSTEROSCOPY, DILATATION & CURETTAGE (no tissue for pathology), THERMACHOICE ABLATION (Total Therapy Time=52min23sec);  Surgeon: Jonnie Kind, MD;  Location: [facility];  Service: Gynecology;  Laterality: N/A;  . Polypectomy N/A 10/21/2012    Procedure: REMOVAL OF ENDOMETRIAL POLYP;  Surgeon: Jonnie Kind, MD;  Location: [facility];  Service: Gynecology;  Laterality: N/A;  . Lumbar laminectomy/decompression microdiscectomy Left 09/16/2014    Procedure: LUMBAR LAMINECTOMY/DECOMPRESSION MICRODISCECTOMY;  Surgeon: Phylliss Bob, MD;  Location: [facility];  Service: Orthopedics;  Laterality: Left;  Left sided lumbar 5-  sacrum 1 microdisectomy     There were no vitals filed for this visit.  Visit Diagnosis:  Right hip pain  Abnormality of gait  Stiffness of right ankle joint  Generalized weakness      Subjective Assessment - 03/17/15 0900    Subjective Patient reports she has more pain today likely due to colder weather. Her pain is also in the foot today which normally has not been bothering her.   Patient Stated Goals walk without a limp so I can get this hernia surgery   Currently in Pain? Yes   Pain Score 4    Pain Location Hip   Pain Orientation Right   Pain Descriptors / Indicators Aching   Pain Onset More than a month ago   Pain Frequency Intermittent   Aggravating Factors  prolonged walking, cold weather   Pain Relieving Factors rest, elevation   Effect of Pain on Daily Activities limited   Multiple Pain Sites Yes   Pain Score 4   Pain Location Foot   Pain Orientation Right   Pain Descriptors / Indicators Aching   Pain Onset More than a month ago   Pain Frequency Intermittent   Aggravating Factors  cold weather   Pain Relieving Factors warm weather   Effect of Pain on Daily Activities limited                         [facility] Adult PT Treatment/Exercise - 03/17/15 0001  Ambulation/Gait   Pre-Gait Activities in II bars: semi tandem weight shifting  pt c/o pain in foot    Knee/Hip Exercises: Stretches   Piriformis Stretch 1 rep;20 seconds  cross body   Other Knee/Hip Stretches SKTC on R   Other Knee/Hip Stretches faber stretch   Knee/Hip Exercises: Aerobic   Nustep L5 adjusting seat forward to 9 to provide greater stretch into hip flexion x 15   Modalities   Modalities Ultrasound   Ultrasound   Ultrasound Location R plantar fascia   Ultrasound Parameters 1.5 wcm2 3.3 mhz cont x 8 min   Ultrasound Goals Pain   Manual Therapy   Manual Therapy Joint mobilization;Myofascial release   Manual therapy comments marked pain with MFR and cuboid mobs   Joint  Mobilization R metatarsal, tarsal mobs (especially cuboid)   Myofascial Release R plantar fascia   Ankle Exercises: Stretches   Plantar Fascia Stretch 1 rep  off step   Ankle Exercises: Supine   Other Supine Ankle Exercises PNF diagonals x 10 each way                PT Education - 03/17/15 1328    Education provided Yes   Education Details Use tennis ball or rolling pin to roll R plantar fascia multiple times daily. Also, return to using one crutch to normalize gait.   Person(s) Educated Patient   Methods Explanation;Demonstration   Comprehension Verbalized understanding          PT Short Term Goals - 03/03/15 1311    PT SHORT TERM GOAL #1   Title I with initial HEP 02/15/15   Time 2   Period Weeks   Status Achieved   PT SHORT TERM GOAL #2   Title able to amb safely with RW and PWB 200 ft   Time 2   Status Achieved           PT Long Term Goals - 03/15/15 0914    PT LONG TERM GOAL #1   Title improved ankle ROM to [facility]   Time 6   Period Weeks   Status On-going   PT LONG TERM GOAL #2   Title decreased hip pain to 2/10 overall   Time 6   Period Weeks   Status Achieved   PT LONG TERM GOAL #3   Title patient able to sleep in bed with minimal discomfort   Status Achieved   PT LONG TERM GOAL #4   Title demo 4+/5 RLE strength to improve function   Time 6   Period Weeks   Status On-going   PT LONG TERM GOAL #5   Title decreased R ankle edema to within 2.5 cm of L ankle   Time 6   Period Weeks   Status On-going   PT LONG TERM GOAL #6   Title Patient able to ambulate safely on level surfaces without AD   Time 6   Period Weeks   Status On-going               Plan - 03/17/15 1330    Clinical Impression Statement Patient presented with increased foot pain today. She has marked tighness in R plantar fascia and with tarsal mobs. She also c/o increased pain into BLE with prolonged sitting of 30 min or longer. PT advised lumbar support with sitting as  well as returning to using single crutch with amb as patient demonstrates considerable antalgic gait pattern without AD. It's possible her back has been aggravated with  this gait pattern. Patient responded well to [place]. Manual therapy was painful but tolerable to patient.    PT Next Visit Plan continue [place] and manual therapy to R plantar fascia/tarsals to nomalize midfoot motion. Continue R hip flexibility to help decrease pain and normalize gait. Gait training with single crutch or cane as needed.        Problem List Patient Active Problem List   Diagnosis Date Noted  . Status post-operative repair of hip fracture 12/28/2014  . Folliculitis of mons  pubis. 11/03/2012  . Migraines 08/06/2012  . Precordial pain 02/08/2012  . Palpitations 02/08/2012    Madelyn Flavors PT  03/17/2015, 1:43 PM  [facility] [place], [place], [address] Phone: [phone]   Fax:  [phone]  Name: DEBBORAH GHEZZI MRN: [id number] Date of Birth: [birth year]

## 2015-03-18 ENCOUNTER — Encounter: Payer: Self-pay | Admitting: Pediatrics

## 2015-03-18 ENCOUNTER — Ambulatory Visit (INDEPENDENT_AMBULATORY_CARE_PROVIDER_SITE_OTHER): Payer: BLUE CROSS/BLUE SHIELD | Admitting: Pediatrics

## 2015-03-18 VITALS — BP 115/74 | HR 80 | Temp 97.3°F | Ht 65.0 in | Wt 219.8 lb

## 2015-03-18 DIAGNOSIS — J019 Acute sinusitis, unspecified: Secondary | ICD-10-CM | POA: Diagnosis not present

## 2015-03-18 DIAGNOSIS — R600 Localized edema: Secondary | ICD-10-CM | POA: Diagnosis not present

## 2015-03-18 MED ORDER — AMOXICILLIN-POT CLAVULANATE 875-125 MG PO TABS: 1.0000 | ORAL_TABLET | Freq: Two times a day (BID) | ORAL | Status: DC
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
## 2015-03-18 NOTE — Patient Instructions (Signed)
Neti pot twice a day with distilled water

## 2015-03-22 ENCOUNTER — Ambulatory Visit: Payer: BLUE CROSS/BLUE SHIELD | Admitting: Physical Therapy

## 2015-03-22 DIAGNOSIS — M25551 Pain in right hip: Secondary | ICD-10-CM | POA: Diagnosis not present

## 2015-03-22 DIAGNOSIS — R531 Weakness: Secondary | ICD-10-CM

## 2015-03-22 DIAGNOSIS — M25651 Stiffness of right hip, not elsewhere classified: Secondary | ICD-10-CM
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
## 2015-03-23 ENCOUNTER — Telehealth: Payer: Self-pay | Admitting: Nurse Practitioner

## 2015-03-23 MED ORDER — ESCITALOPRAM OXALATE 10 MG PO TABS: 10.0000 mg | ORAL_TABLET | Freq: Every day | ORAL | Status: DC

## 2015-03-23 NOTE — Telephone Encounter (Signed)
Pt has appt scheduled 12/20 with MMM.
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
## 2015-03-24 ENCOUNTER — Ambulatory Visit: Payer: BLUE CROSS/BLUE SHIELD | Admitting: Physical Therapy

## 2015-03-24 DIAGNOSIS — M25551 Pain in right hip: Secondary | ICD-10-CM

## 2015-03-24 DIAGNOSIS — R531 Weakness: Secondary | ICD-10-CM

## 2015-03-24 DIAGNOSIS — R269 Unspecified abnormalities of gait and mobility: Secondary | ICD-10-CM

## 2015-03-24 DIAGNOSIS — M25651 Stiffness of right hip, not elsewhere classified: Secondary | ICD-10-CM

## 2015-03-24 NOTE — Patient Instructions (Signed)
  Quadriceps (Prone)   On stomach with sheet around ankles, knees together, hips down, pull heels toward bottom. Keep hips flat. Hold __30__ seconds. Repeat _3__ times. Do __3__ sessions per day. CAUTION: Stretch should be gentle, steady and slow.    Outer Hip Stretch: Reclined IT Band Stretch (Strap)   Strap around opposite foot, pull across only as far as possible with shoulders on mat. Hold for __30__ seconds. Repeat __3__ times each leg. 2-3 x/day.  Copyright  VHI. All rights reserved.   Madelyn Flavors, PT  [facility] [place], [place], [address] Phone: [phone]   Fax:  [phone]
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
## 2015-03-29 ENCOUNTER — Ambulatory Visit: Payer: BLUE CROSS/BLUE SHIELD | Admitting: Physical Therapy

## 2015-03-29 DIAGNOSIS — M25651 Stiffness of right hip, not elsewhere classified: Secondary | ICD-10-CM

## 2015-03-29 DIAGNOSIS — R531 Weakness: Secondary | ICD-10-CM

## 2015-03-29 DIAGNOSIS — M25551 Pain in right hip: Secondary | ICD-10-CM | POA: Diagnosis not present

## 2015-03-29 NOTE — Therapy (Signed)
[facility] [place], [place], [address] Phone: [phone]   Fax:  [phone]  Physical Therapy Treatment  Patient Details  Name: Margaret Cruz MRN: [id number] Date of Birth: 08-23-1970 Referring Provider: Dorma Russell [facility] (Dr. Doran Durand)  Encounter Date: 03/29/2015      PT End of Session - 03/29/15 1010    Visit Number 10   Number of Visits 21   Date for PT Re-Evaluation 04/14/15   PT Start Time 0903   PT Stop Time 1006   PT Time Calculation (min) 63 min      Past Medical History  Diagnosis Date  . RLS (restless legs syndrome)   . Headache(784.0)   . Menorrhagia 09/26/2012  . Endometrial polyp 09/26/2012  . Overactive bladder   . PONV (postoperative nausea and vomiting)   . Family history of adverse reaction to anesthesia     MOther difficulty awaken  . Shortness of breath dyspnea     with exertion  . GERD (gastroesophageal reflux disease)     not all the time.  . IBS (irritable bowel syndrome)   . Arthritis     Past Surgical History  Procedure Laterality Date  . Cholecystectomy    . Tubal ligation    . Dilitation & currettage/hystroscopy with thermachoice ablation N/A 10/21/2012    Procedure: HYSTEROSCOPY, DILATATION & CURETTAGE (no tissue for pathology), THERMACHOICE ABLATION (Total Therapy Time=82min23sec);  Surgeon: Jonnie Kind, MD;  Location: [facility];  Service: Gynecology;  Laterality: N/A;  . Polypectomy N/A 10/21/2012    Procedure: REMOVAL OF ENDOMETRIAL POLYP;  Surgeon: Jonnie Kind, MD;  [facility];  Service: Gynecology;  Laterality: N/A;  . Lumbar laminectomy/decompression microdiscectomy Left 09/16/2014    Procedure: LUMBAR LAMINECTOMY/DECOMPRESSION MICRODISCECTOMY;  Surgeon: Phylliss Bob, MD;  Location: [place];  Service: Orthopedics;  Laterality: Left;  Left sided lumbar 5- sacrum 1 microdisectomy     There were no vitals filed for this visit.  Visit Diagnosis:  Generalized  weakness  Right hip pain  Stiffness of right hip joint      Subjective Assessment - 03/29/15 1013    Subjective My hip Dr. has released me.   Patient Stated Goals walk without a limp so I can get this hernia surgery   Currently in Pain? Yes   Pain Score 1    Pain Location Hip   Pain Orientation Right                                   PT Short Term Goals - 03/03/15 1311    PT SHORT TERM GOAL #1   Title I with initial HEP 02/15/15   Time 2   Period Weeks   Status Achieved   PT SHORT TERM GOAL #2   Title able to amb safely with RW and PWB 200 ft   Time 2   Status Achieved           PT Long Term Goals - 03/24/15 1347    PT LONG TERM GOAL #1   Title improved ankle ROM to [facility]   Time 6   Period Weeks   Status On-going   PT LONG TERM GOAL #2   Title decreased hip pain to 2/10 overall   Time 6   Period Weeks   Status Achieved   PT LONG TERM GOAL #3   Title patient able to sleep in bed with  minimal discomfort   Time 4   Period Weeks   Status Achieved   PT LONG TERM GOAL #4   Title demo 4+/5 RLE strength to improve function   Time 6   Status On-going   PT LONG TERM GOAL #5   Title decreased R ankle edema to within 2.5 cm of L ankle   Time 6   Period Weeks   Status On-going   Additional Long Term Goals   Additional Long Term Goals Yes   PT LONG TERM GOAL #6   Title Patient able to ambulate safely on level surfaces without AD   Period Weeks   Status On-going               Problem List Patient Active Problem List   Diagnosis Date Noted  . Edema leg 03/18/2015  . Status post-operative repair of hip fracture 12/28/2014  . Folliculitis of mons  pubis. 11/03/2012  . Migraines 08/06/2012  . Precordial pain 02/08/2012  . Palpitations 02/08/2012   Treatment:  Elliptical L1/L1 x 8 minutes (4 minutes forward and 4 minutes backward) f/b 1-1 right hip PROM; TFL stretching and right hip abduction isometrics x 30 minutes.  Patient  did great today. Sandip Power, [place] MPT 03/29/2015, 10:32 AM  [facility] [address] [place], [place], [address] Phone: [phone]   Fax:  [phone]  Name: Margaret Cruz MRN: [id number] Date of Birth: 1971-02-26

## 2015-03-31 ENCOUNTER — Encounter: Payer: BLUE CROSS/BLUE SHIELD | Admitting: Physical Therapy

## 2015-04-05 ENCOUNTER — Encounter: Payer: Self-pay | Admitting: Physical Therapy

## 2015-04-05 ENCOUNTER — Ambulatory Visit: Payer: BLUE CROSS/BLUE SHIELD | Admitting: Physical Therapy

## 2015-04-05 ENCOUNTER — Encounter: Payer: Self-pay | Admitting: Nurse Practitioner

## 2015-04-05 ENCOUNTER — Ambulatory Visit (INDEPENDENT_AMBULATORY_CARE_PROVIDER_SITE_OTHER): Payer: BLUE CROSS/BLUE SHIELD | Admitting: Nurse Practitioner

## 2015-04-05 VITALS — BP 110/70 | HR 75 | Temp 97.2°F | Ht 65.0 in | Wt 219.0 lb

## 2015-04-05 DIAGNOSIS — R600 Localized edema: Secondary | ICD-10-CM

## 2015-04-05 DIAGNOSIS — F32A Depression, unspecified: Secondary | ICD-10-CM

## 2015-04-05 DIAGNOSIS — G2581 Restless legs syndrome: Secondary | ICD-10-CM

## 2015-04-05 DIAGNOSIS — G4700 Insomnia, unspecified: Secondary | ICD-10-CM | POA: Diagnosis not present

## 2015-04-05 DIAGNOSIS — M25651 Stiffness of right hip, not elsewhere classified: Secondary | ICD-10-CM

## 2015-04-05 DIAGNOSIS — M25671 Stiffness of right ankle, not elsewhere classified: Secondary | ICD-10-CM

## 2015-04-05 DIAGNOSIS — R531 Weakness: Secondary | ICD-10-CM

## 2015-04-05 DIAGNOSIS — M25551 Pain in right hip: Secondary | ICD-10-CM

## 2015-04-05 DIAGNOSIS — F329 Major depressive disorder, single episode, unspecified: Secondary | ICD-10-CM | POA: Diagnosis not present

## 2015-04-05 DIAGNOSIS — M25471 Effusion, right ankle: Secondary | ICD-10-CM

## 2015-04-05 DIAGNOSIS — R269 Unspecified abnormalities of gait and mobility: Secondary | ICD-10-CM

## 2015-04-05 MED ORDER — METHOCARBAMOL 500 MG PO TABS: 500.0000 mg | ORAL_TABLET | Freq: Four times a day (QID) | ORAL | Status: DC

## 2015-04-05 MED ORDER — PRAMIPEXOLE DIHYDROCHLORIDE 1.5 MG PO TABS: 1.5000 mg | ORAL_TABLET | Freq: Three times a day (TID) | ORAL | Status: DC

## 2015-04-05 MED ORDER — ZOLPIDEM TARTRATE 5 MG PO TABS: 5.0000 mg | ORAL_TABLET | Freq: Every evening | ORAL | Status: DC | PRN
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
## 2015-04-06 LAB — CMP14+EGFR
A/G RATIO: 1.6 (ref 1.1–2.5)
ALBUMIN: 3.8 g/dL (ref 3.5–5.5)
ALT: 11 IU/L (ref 0–32)
AST: 10 IU/L (ref 0–40)
Alkaline Phosphatase: 96 IU/L (ref 39–117)
BILIRUBIN TOTAL: 0.6 mg/dL (ref 0.0–1.2)
BUN / CREAT RATIO: 10 (ref 9–23)
BUN: 6 mg/dL (ref 6–24)
CO2: 24 mmol/L (ref 18–29)
Calcium: 9.5 mg/dL (ref 8.7–10.2)
Chloride: 101 mmol/L (ref 96–106)
Creatinine, Ser: 0.59 mg/dL (ref 0.57–1.00)
GFR calc non Af Amer: 113 mL/min/{1.73_m2} (ref >59)
GFR, EST AFRICAN AMERICAN: 130 mL/min/{1.73_m2} (ref >59)
GLOBULIN, TOTAL: 2.4 g/dL (ref 1.5–4.5)
Glucose: 79 mg/dL (ref 65–99)
POTASSIUM: 4.7 mmol/L (ref 3.5–5.2)
Sodium: 138 mmol/L (ref 134–144)
TOTAL PROTEIN: 6.2 g/dL (ref 6.0–8.5)

## 2015-04-06 LAB — LIPID PANEL
CHOL/HDL RATIO: 2.8 ratio (ref 0.0–4.4)
Cholesterol, Total: 166 mg/dL (ref 100–199)
HDL: 60 mg/dL (ref >39)
LDL Calculated: 88 mg/dL (ref 0–99)
Triglycerides: 92 mg/dL (ref 0–149)
VLDL Cholesterol Cal: 18 mg/dL (ref 5–40)

## 2015-04-07 ENCOUNTER — Ambulatory Visit: Payer: BLUE CROSS/BLUE SHIELD | Admitting: *Deleted

## 2015-04-07 DIAGNOSIS — M25671 Stiffness of right ankle, not elsewhere classified: Secondary | ICD-10-CM

## 2015-04-07 DIAGNOSIS — M25551 Pain in right hip: Secondary | ICD-10-CM | POA: Diagnosis not present

## 2015-04-07 DIAGNOSIS — R269 Unspecified abnormalities of gait and mobility: Secondary | ICD-10-CM

## 2015-04-07 DIAGNOSIS — R531 Weakness: Secondary | ICD-10-CM

## 2015-04-07 DIAGNOSIS — M25471 Effusion, right ankle: Secondary | ICD-10-CM

## 2015-04-07 DIAGNOSIS — M25651 Stiffness of right hip, not elsewhere classified: Secondary | ICD-10-CM
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
## 2015-04-19 ENCOUNTER — Encounter: Payer: Self-pay | Admitting: *Deleted

## 2015-04-19 ENCOUNTER — Ambulatory Visit: Payer: BLUE CROSS/BLUE SHIELD | Attending: Orthopedic Surgery | Admitting: *Deleted

## 2015-04-19 DIAGNOSIS — M25651 Stiffness of right hip, not elsewhere classified: Secondary | ICD-10-CM | POA: Diagnosis present

## 2015-04-19 DIAGNOSIS — M25471 Effusion, right ankle: Secondary | ICD-10-CM | POA: Diagnosis present

## 2015-04-19 DIAGNOSIS — R269 Unspecified abnormalities of gait and mobility: Secondary | ICD-10-CM | POA: Diagnosis present

## 2015-04-19 DIAGNOSIS — R531 Weakness: Secondary | ICD-10-CM

## 2015-04-19 DIAGNOSIS — M25551 Pain in right hip: Secondary | ICD-10-CM | POA: Insufficient documentation

## 2015-04-19 DIAGNOSIS — M25671 Stiffness of right ankle, not elsewhere classified: Secondary | ICD-10-CM | POA: Insufficient documentation
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
## 2015-04-21 ENCOUNTER — Ambulatory Visit: Payer: BLUE CROSS/BLUE SHIELD | Admitting: *Deleted

## 2015-04-21 DIAGNOSIS — M25551 Pain in right hip: Secondary | ICD-10-CM | POA: Diagnosis not present

## 2015-04-21 DIAGNOSIS — M25651 Stiffness of right hip, not elsewhere classified: Secondary | ICD-10-CM

## 2015-04-21 DIAGNOSIS — M25471 Effusion, right ankle: Secondary | ICD-10-CM

## 2015-04-21 DIAGNOSIS — M25671 Stiffness of right ankle, not elsewhere classified: Secondary | ICD-10-CM

## 2015-04-21 DIAGNOSIS — R269 Unspecified abnormalities of gait and mobility: Secondary | ICD-10-CM

## 2015-04-21 DIAGNOSIS — R531 Weakness: Secondary | ICD-10-CM
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
## 2015-04-27 ENCOUNTER — Ambulatory Visit: Payer: BLUE CROSS/BLUE SHIELD | Admitting: *Deleted

## 2015-04-27 ENCOUNTER — Encounter: Payer: Self-pay | Admitting: *Deleted

## 2015-04-27 DIAGNOSIS — M25551 Pain in right hip: Secondary | ICD-10-CM

## 2015-04-27 DIAGNOSIS — M25471 Effusion, right ankle: Secondary | ICD-10-CM

## 2015-04-27 DIAGNOSIS — M25651 Stiffness of right hip, not elsewhere classified: Secondary | ICD-10-CM

## 2015-04-27 DIAGNOSIS — R269 Unspecified abnormalities of gait and mobility: Secondary | ICD-10-CM

## 2015-04-27 DIAGNOSIS — R531 Weakness: Secondary | ICD-10-CM

## 2015-04-27 DIAGNOSIS — M25671 Stiffness of right ankle, not elsewhere classified: Secondary | ICD-10-CM
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
## 2015-04-28 ENCOUNTER — Encounter: Payer: BLUE CROSS/BLUE SHIELD | Admitting: Physical Therapy

## 2015-05-03 ENCOUNTER — Ambulatory Visit: Payer: BLUE CROSS/BLUE SHIELD | Admitting: Physical Therapy

## 2015-05-03 DIAGNOSIS — R269 Unspecified abnormalities of gait and mobility: Secondary | ICD-10-CM

## 2015-05-03 DIAGNOSIS — M25471 Effusion, right ankle: Secondary | ICD-10-CM

## 2015-05-03 DIAGNOSIS — M25551 Pain in right hip: Secondary | ICD-10-CM | POA: Diagnosis not present
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
## 2015-05-05 ENCOUNTER — Encounter: Payer: BLUE CROSS/BLUE SHIELD | Admitting: Physical Therapy

## 2015-05-10 ENCOUNTER — Ambulatory Visit: Payer: BLUE CROSS/BLUE SHIELD | Admitting: Physical Therapy

## 2015-05-11 ENCOUNTER — Encounter: Payer: Self-pay | Admitting: Family Medicine

## 2015-05-11 ENCOUNTER — Ambulatory Visit (INDEPENDENT_AMBULATORY_CARE_PROVIDER_SITE_OTHER): Payer: BLUE CROSS/BLUE SHIELD | Admitting: Family Medicine

## 2015-05-11 VITALS — BP 112/75 | HR 79 | Temp 97.2°F | Ht 65.0 in | Wt 215.4 lb

## 2015-05-11 DIAGNOSIS — J320 Chronic maxillary sinusitis: Secondary | ICD-10-CM

## 2015-05-11 MED ORDER — HYDROCODONE-HOMATROPINE 5-1.5 MG/5ML PO SYRP: 5.0000 mL | ORAL_SOLUTION | Freq: Three times a day (TID) | ORAL | Status: DC | PRN

## 2015-05-11 MED ORDER — PREDNISONE 10 MG PO TABS: ORAL_TABLET | ORAL | Status: DC

## 2015-05-11 MED ORDER — AMOXICILLIN-POT CLAVULANATE 875-125 MG PO TABS: 1.0000 | ORAL_TABLET | Freq: Two times a day (BID) | ORAL | Status: DC
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
## 2015-05-12 ENCOUNTER — Ambulatory Visit: Payer: BLUE CROSS/BLUE SHIELD | Attending: Orthopedic Surgery | Admitting: Physical Therapy

## 2015-05-12 ENCOUNTER — Encounter: Payer: Self-pay | Admitting: Physical Therapy

## 2015-05-12 DIAGNOSIS — M545 Low back pain, unspecified: Secondary | ICD-10-CM

## 2015-05-12 DIAGNOSIS — M25551 Pain in right hip: Secondary | ICD-10-CM

## 2015-05-12 DIAGNOSIS — R531 Weakness: Secondary | ICD-10-CM | POA: Diagnosis present

## 2015-05-12 NOTE — Patient Instructions (Signed)
   Lower Trunk Rotation Stretch   Keeping back flat and feet together, rotate knees to left side. Hold __10__ seconds. Repeat __5__ times per set. Do ____ sets per session. Do _2_ sessions per day.     Lower abdominal/core stability exercises  1. Practice your breathing technique: Inhale through your nose expanding your belly and rib cage. Try not to breathe into your chest. Exhale slowly and gradually out your mouth feeling a sense of softness to your body. Practice multiple times. This can be performed unlimited.  2. Finding the lower abdominals. Laying on your back with the knees bent, place your fingers just below your belly button. Using your breathing technique from above, on your exhale gently pull the belly button away from your fingertips without tensing any other muscles. Practice this 5x. Next, as you exhale, draw belly button inwards and hold onto it...then feel as if you are pulling that muscle across your pelvis like you are tightening a belt. This can be hard to do at first so be patient and practice. Do 5-10 reps 1-3 x day. Always recognize quality over quantity; if your abdominal muscles become tired you will notice you may tighten/contract other muscles. This is the time to take a break.   Practice this first laying on your back, then in sitting, progressing to standing and finally adding it to all your daily movements.   3. Finding your pelvic floor. Using the breathing technique above, when your exhale, this time draw your pelvic floor muscles up as if you were attempting to stop the flow of urination. Be careful NOT to tense any other muscles. This can be hard, BE PATIENT. Try to hold up to 10 seconds repeating 10x. Try 2x a day. Once you feel you are doing this well, add this contraction to exercise #2. First contracting your pelvic floor followed by lower abdominals.   4. Adding leg movements. Add the following leg movements to challenge your ability to keep your core  stable:  1. Single leg drop outs: Laying on your back with knees bent feet flat. Inhale,  dropping one knee outward KEEPING YOUR PELVIS STILL. Exhale as you bring the leg back, simultaneously performing your lower abdominal contraction. Do 5-10 on each leg.   2. Marching: While keeping your pelvis still, lift the right foot a few inches, put it down then lift left foot. This will mimic a march. Start slow to establish control. Once you have control you may speed it up. Do 10-20x. You MUST keep your lower abdominlas contracted while you march. Breathe naturally    3. Single leg slides: Inhale while you slowly slide one leg out keeping your pelvis still. Only slide your leg as far as you can keep your pelvis still. Exhale as you bring the leg back to the start, contracting the lower abdominals as you do that. Keep your upper body relaxed. Do 5-10 on each side.     Margaret Cruz, PT 05/12/2015 1:40 PM [facility] [address] [place], [place], [address] Phone: [phone]   Fax:  [phone]
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
## 2015-05-16 ENCOUNTER — Ambulatory Visit: Payer: Self-pay | Admitting: Surgery

## 2015-05-16 NOTE — H&P (Signed)
Margaret Cruz 05/16/2015 1:53 PM Location: [facility] Patient #: [id number] DOB: Oct 28, 1970 Married / Language: English / Race: White Female  History of Present Illness Margaret Cruz A. Margaret Saari MD; 05/16/2015 3:37 PM) Patient words: hernia    Patient returns for follow-up of her right flank traumatic hernia. She is recovered from her orthopedic injuries and is ready for repair. Hernias located along the right anterior axillary line in between the right costal margin in the right anterior axillary line and ASIS . It is soft and reducible. She is starting to have more pain from this from time to time but wears a binder which helps.  The patient is a 45 year old female.   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  Diagnostic Studies History [name], [place]; 05/16/2015 1:54 PM) Colonoscopy 5-10 years ago Mammogram 1-3 years ago Pap Smear 1-5 years ago  Allergies [name], [name]; 05/16/2015 1:54 PM) No Known Drug Allergies 02/25/2015  Medication History [name], [place]; 05/16/2015 1:54 PM) Zolpidem Tartrate (5MG  Tablet, Oral) Active. Hydrocodone-Acetaminophen (5-325MG  Tablet, Oral) Active. Aspirin (325MG  Tablet, Oral) Active. Medications Reconciled  Social History [name], [place]; 05/16/2015 1:54 PM) Caffeine use Carbonated beverages, Tea. No alcohol use No drug use Tobacco use Never smoker.  Family History [name], [place]; 05/16/2015 1:54 PM) Cervical Cancer Mother. Heart Disease Father. Heart disease in female family member before age 43 Hypertension Father, Mother.  Pregnancy / Birth  History [name], [place]; 05/16/2015 1:54 PM) Age at menarche 1 years. Gravida 4 Maternal age 57-20 Para 4 Regular periods    Vitals [name] [name]; 05/16/2015 1:53 PM) 05/16/2015 1:53 PM Weight: 216 lb Height: 65in Body Surface Area: 2.04 m Body Mass Index: 35.94 kg/m  BP: 140/82 (Sitting, Left Arm, Standard)      Physical Exam ([name] A. Banessa Mao MD; 05/16/2015 3:38 PM)  General Mental Status-Alert. General Appearance-Consistent with stated age. Hydration-Well hydrated. Voice-Normal.  Chest and Lung Exam Chest and lung exam reveals -quiet, even and easy respiratory effort with no use of accessory muscles and on auscultation, normal breath sounds, no adventitious sounds and normal vocal resonance. Inspection Chest Wall - Normal. Back - normal.  Cardiovascular Cardiovascular examination reveals -normal heart sounds, regular rate and rhythm with no murmurs and normal pedal pulses bilaterally.  Abdomen Note: Reducible right flank abdominal wall hernia located just below the right costal margin in the anterior axillary line.  Neurologic Neurologic evaluation reveals -alert and oriented x 3 with no impairment of recent or remote memory. Mental Status-Normal.  Musculoskeletal Normal Exam - Left-Upper Extremity Strength Normal and Lower Extremity Strength Normal. Normal Exam - Right-Upper Extremity Strength Normal and Lower Extremity Strength Normal.    Assessment & Plan ([name] A. 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 Discussed laparoscopic and open techniques with mesh. Risks and benefits of each discussed. She has chosen open repair of her right flank hernia which was traumatic in nature with mesh. The risk of hernia repair include bleeding, infection, organ injury, bowel injury, bladder injury, nerve  injury recurrent hernia, blood clots, worsening of underlying condition, chronic pain, mesh use,  open surgery, death, and the need for other operattions. Pt agrees to proceed  Current Plans You are being scheduled for surgery - Our schedulers will call you.  You should hear from our office's scheduling department within 5 working days about the location, date, and time of surgery. We try to make accommodations for patient's preferences in scheduling surgery, but sometimes the OR schedule or the surgeon's schedule prevents [place] from making those accommodations.  If you have not heard from our office [phone]) in 5 working days, call the office and ask for your surgeon's nurse.  If you have other questions about your diagnosis, plan, or surgery, call the office and ask for your surgeon's nurse.  The anatomy & physiology of the abdominal wall and pelvic floor was discussed. The pathophysiology of hernias in the inguinal and pelvic region was discussed. Natural history risks such as progressive enlargement, pain, incarceration, and strangulation was discussed. Contributors to complications such as smoking, obesity, diabetes, prior surgery, etc were discussed.  I feel the risks of no intervention will lead to serious problems that outweigh the operative risks; therefore, I recommended surgery to reduce and repair the hernia. I explained laparoscopic techniques with possible need for an open approach. I noted usual use of mesh to patch and/or buttress hernia repair  Risks such as bleeding, infection, abscess, need for further treatment, heart attack, death, and other risks were discussed. I noted a good likelihood this will help address the problem. Goals of post-operative recovery were discussed as well. Possibility that this will not correct all symptoms was explained. I stressed the importance of low-impact activity, aggressive pain control, avoiding constipation, & not pushing through pain to  minimize risk of post-operative chronic pain or injury. Possibility of reherniation was discussed. We will work to minimize complications.  An educational handout further explaining the pathology & treatment options was given as well. Questions were answered. The patient expresses understanding & wishes to proceed with surgery.  Pt Education - [facility] Hernia Post-Op HCI ([place]): discussed with patient and provided information. Pt Education - [facility] Pain Control ([place]) Pt Education - Pamphlet Given - Hernia Surgery: discussed with patient and provided information.

## 2015-05-17 ENCOUNTER — Ambulatory Visit: Payer: BLUE CROSS/BLUE SHIELD | Admitting: Physical Therapy

## 2015-05-17 DIAGNOSIS — R531 Weakness: Secondary | ICD-10-CM

## 2015-05-17 DIAGNOSIS — M545 Low back pain: Secondary | ICD-10-CM | POA: Diagnosis not present

## 2015-05-17 DIAGNOSIS — M25551 Pain in right hip: Secondary | ICD-10-CM
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
## 2015-05-17 NOTE — Patient Instructions (Addendum)
Pelvic Press  [id number]   Place hands under belly between navel and pubic bone, palms up. Feel pressure on hands. Increase pressure on hands by pressing pelvis down. Do not squeeze buttocks. This is NOT a pelvic tilt. Hold __5_ seconds. Relax. Repeat _10__ times. Once a day.  KNEE: Flexion - Prone   Hold pelvic press. Bend knee. Raise heel toward buttocks. Repeat on opposite leg. Do not raise hips. _10__ reps per set. When this is mastered, pull both heels up at same time, x 10 reps.  Once a day   Hip Extension (Prone)  Hold pelvic press  Lift left leg _3___ inches from floor, keeping knee locked. Repeat __10__ times per set. Do _1___ sets per session. Do _10___ sessions per day.  HIP: Extension / KNEE: Flexion - Prone    Hold pelvic press. Bend knee, squeeze glutes. Raise leg up  10___ reps per set, _2__ sets per day, _5__ days per week  Axial Extension- Upper body sequence * always start with pelvic press    Lie on stomach with forehead resting on floor and arms at sides. Tuck chin in and raise head from floor without bending it up or down. Repeat ___10_ times per set. Do __1__ sets per session. Do _1___ sessions per day.  Progression:  Arms at side Arms in T shape Arms in W shape  Arms in M shape Arms in Y shape    Madelyn Flavors, PT 05/17/2015 3:17 PM [facility] [place], [place], [address] Phone: [phone]   Fax:  [phone]

## 2015-05-19 ENCOUNTER — Encounter: Payer: BLUE CROSS/BLUE SHIELD | Admitting: Physical Therapy

## 2015-05-24 ENCOUNTER — Encounter (HOSPITAL_COMMUNITY): Payer: Self-pay

## 2015-05-24 ENCOUNTER — Encounter (HOSPITAL_COMMUNITY)
Admission: RE | Admit: 2015-05-24 | Discharge: 2015-05-24 | Disposition: A | Discharge status: Home / Self Care | Payer: BLUE CROSS/BLUE SHIELD | Source: Ambulatory Visit | Attending: Surgery | Admitting: Surgery

## 2015-05-24 HISTORY — DX: Personal history of other venous thrombosis and embolism: Z86.718

## 2015-05-24 HISTORY — DX: Depression, unspecified: F32.A

## 2015-05-24 HISTORY — DX: Major depressive disorder, single episode, unspecified: F32.9

## 2015-05-24 LAB — BASIC METABOLIC PANEL
Anion gap: 11 (ref 5–15)
CHLORIDE: 103 mmol/L (ref 101–111)
CO2: 23 mmol/L (ref 22–32)
CREATININE: 0.69 mg/dL (ref 0.44–1.00)
Calcium: 9.4 mg/dL (ref 8.9–10.3)
GFR calc Af Amer: >60 mL/min (ref >60)
GFR calc non Af Amer: >60 mL/min (ref >60)
Glucose, Bld: 121 mg/dL — ABNORMAL HIGH (ref 65–99)
Potassium: 4.6 mmol/L (ref 3.5–5.1)
SODIUM: 137 mmol/L (ref 135–145)

## 2015-05-24 LAB — CBC
HCT: 44.7 % (ref 36.0–46.0)
Hemoglobin: 14.7 g/dL (ref 12.0–15.0)
MCH: 29 pg (ref 26.0–34.0)
MCHC: 32.9 g/dL (ref 30.0–36.0)
MCV: 88.2 fL (ref 78.0–100.0)
PLATELETS: 267 10*3/uL (ref 150–400)
RBC: 5.07 MIL/uL (ref 3.87–5.11)
RDW: 13.9 % (ref 11.5–15.5)
WBC: 11.2 10*3/uL — AB (ref 4.0–10.5)

## 2015-05-24 LAB — HCG, SERUM, QUALITATIVE: Preg, Serum: NEGATIVE

## 2015-05-24 MED ORDER — CHLORHEXIDINE GLUCONATE 4 % EX LIQD: 1.0000 "application " | Freq: Once | CUTANEOUS | Status: DC

## 2015-05-24 MED ORDER — DEXTROSE 5 % IV SOLN
3.0000 g | INTRAVENOUS | Status: AC
  Administered 2015-05-25: 3 g via INTRAVENOUS
  Filled 2015-05-24: qty 3000

## 2015-05-24 NOTE — Progress Notes (Signed)
PCP is Dr. Chevis Pretty Pt states she saw Dr Percival Spanish back in 03-2012. Echo noted from 02-01-12 Ekg noted in epic from 09-09-14 Note from Willeen Cass noted in epic from 09-13-14 Denies ever having a card cath,or stress test.

## 2015-05-24 NOTE — Pre-Procedure Instructions (Signed)
REILY HAVNER  05/24/2015      [facility] [address] [place] - [address] [place] [address] Phone: [phone] Fax: [phone]  [facility] [address] [place] [address] Phone: [phone] Fax: [phone]    Your procedure is scheduled on Feb 8.  Report to [facility] Admitting at [phone].  Call this number if you have problems the morning of surgery:  [phone]   Remember:  Do not eat food or drink liquids after midnight.  Take these medicines the morning of surgery with A SIP OF WATER: Methocarbamol (Robaxin), Pramipexole (Mirapex)  Stop taking aspirin, Ibuprofen, BC's, Goody's, Herbal medications, Fish Oil, Aleve   Do not wear jewelry, make-up or nail polish.  Do not wear lotions, powders, or perfumes.  You may wear deodorant.  Do not shave 48 hours prior to surgery.  Men may shave face and neck.  Do not bring valuables to the hospital.  [facility] is not responsible for any belongings or valuables.  Contacts, dentures or bridgework may not be worn into surgery.  Leave your suitcase in the car.  After surgery it may be brought to your room.  For patients admitted to the hospital, discharge time will be determined by your treatment team.  Patients discharged the day of surgery will not be allowed to drive home.   Special instructions:  [facility] - Preparing for Surgery  Before surgery, you can play an important role.  Because skin is not sterile, your skin needs to be as free of germs as possible.  You can reduce the number of germs on you skin by washing with CHG (chlorahexidine gluconate) soap before surgery.  CHG is an antiseptic cleaner which kills germs and bonds with the skin to continue killing germs even after washing.  Please DO NOT use if you have an allergy to CHG or antibacterial soaps.  If your skin becomes reddened/irritated stop using the CHG and inform  your nurse when you arrive at Short Stay.  Do not shave (including legs and underarms) for at least 48 hours prior to the first CHG shower.  You may shave your face.  Please follow these instructions carefully:   1.  Shower with CHG Soap the night before surgery and the                                morning of Surgery.  2.  If you choose to wash your hair, wash your hair first as usual with your       normal shampoo.  3.  After you shampoo, rinse your hair and body thoroughly to remove the                      Shampoo.  4.  Use CHG as you would any other liquid soap.  You can apply chg directly       to the skin and wash gently with scrungie or a clean washcloth.  5.  Apply the CHG Soap to your body ONLY FROM THE NECK DOWN.        Do not use on open wounds or open sores.  Avoid contact with your eyes,       ears, mouth and genitals (private parts).  Wash genitals (private parts)  with your normal soap.  6.  Wash thoroughly, paying special attention to the area where your surgery        will be performed.  7.  Thoroughly rinse your body with warm water from the neck down.  8.  DO NOT shower/wash with your normal soap after using and rinsing off       the CHG Soap.  9.  Pat yourself dry with a clean towel.            10.  Wear clean pajamas.            11.  Place clean sheets on your bed the night of your first shower and do not        sleep with pets.  Day of Surgery  Do not apply any lotions/deoderants the morning of surgery.  Please wear clean clothes to the hospital/surgery center.     Please read over the following fact sheets that you were given. Pain Booklet, Coughing and Deep Breathing and Surgical Site Infection Prevention

## 2015-05-25 ENCOUNTER — Inpatient Hospital Stay (HOSPITAL_COMMUNITY)
Admission: AD | Admit: 2015-05-25 | Discharge: 2015-05-26 | DRG: 355 | Disposition: A | Discharge status: Home / Self Care | Payer: BLUE CROSS/BLUE SHIELD | Source: Ambulatory Visit | Attending: Surgery | Admitting: Surgery

## 2015-05-25 ENCOUNTER — Ambulatory Visit (HOSPITAL_COMMUNITY): Payer: BLUE CROSS/BLUE SHIELD | Admitting: Anesthesiology

## 2015-05-25 ENCOUNTER — Encounter (HOSPITAL_COMMUNITY): Payer: Self-pay | Admitting: *Deleted

## 2015-05-25 ENCOUNTER — Encounter (HOSPITAL_COMMUNITY)
Admission: AD | Disposition: A | Discharge status: Home / Self Care | Payer: Self-pay | Source: Ambulatory Visit | Attending: Surgery

## 2015-05-25 DIAGNOSIS — F329 Major depressive disorder, single episode, unspecified: Secondary | ICD-10-CM | POA: Diagnosis present

## 2015-05-25 DIAGNOSIS — Z79899 Other long term (current) drug therapy: Secondary | ICD-10-CM

## 2015-05-25 DIAGNOSIS — G43909 Migraine, unspecified, not intractable, without status migrainosus: Secondary | ICD-10-CM | POA: Diagnosis present

## 2015-05-25 DIAGNOSIS — K439 Ventral hernia without obstruction or gangrene: Secondary | ICD-10-CM | POA: Diagnosis present

## 2015-05-25 DIAGNOSIS — Z6835 Body mass index (BMI) 35.0-35.9, adult: Secondary | ICD-10-CM | POA: Diagnosis not present

## 2015-05-25 DIAGNOSIS — K4090 Unilateral inguinal hernia, without obstruction or gangrene, not specified as recurrent: Secondary | ICD-10-CM | POA: Diagnosis present

## 2015-05-25 DIAGNOSIS — Z7982 Long term (current) use of aspirin: Secondary | ICD-10-CM

## 2015-05-25 HISTORY — PX: INSERTION OF MESH: SHX5868

## 2015-05-25 HISTORY — PX: INGUINAL HERNIA REPAIR: SHX194

## 2015-05-25 SURGERY — REPAIR, HERNIA, INGUINAL, ADULT
Anesthesia: General | Site: Flank | Laterality: Right

## 2015-05-25 MED ORDER — LACTATED RINGERS IV SOLN
INTRAVENOUS | Status: DC
  Administered 2015-05-25 (×3): via INTRAVENOUS

## 2015-05-25 MED ORDER — BUPIVACAINE-EPINEPHRINE (PF) 0.25% -1:200000 IJ SOLN
INTRAMUSCULAR | Status: AC
  Filled 2015-05-25: qty 30

## 2015-05-25 MED ORDER — SIMETHICONE 80 MG PO CHEW: 40.0000 mg | CHEWABLE_TABLET | Freq: Four times a day (QID) | ORAL | Status: DC | PRN

## 2015-05-25 MED ORDER — VECURONIUM BROMIDE 10 MG IV SOLR
INTRAVENOUS | Status: AC
  Filled 2015-05-25: qty 10

## 2015-05-25 MED ORDER — MIDAZOLAM HCL 2 MG/2ML IJ SOLN
INTRAMUSCULAR | Status: AC
  Filled 2015-05-25: qty 2

## 2015-05-25 MED ORDER — PROPOFOL 10 MG/ML IV BOLUS
INTRAVENOUS | Status: DC | PRN
  Administered 2015-05-25: 150 mg via INTRAVENOUS

## 2015-05-25 MED ORDER — DEXAMETHASONE SODIUM PHOSPHATE 10 MG/ML IJ SOLN
INTRAMUSCULAR | Status: AC
  Filled 2015-05-25: qty 1

## 2015-05-25 MED ORDER — MIDAZOLAM HCL 5 MG/5ML IJ SOLN
INTRAMUSCULAR | Status: DC | PRN
  Administered 2015-05-25: 2 mg via INTRAVENOUS

## 2015-05-25 MED ORDER — HYDROMORPHONE HCL 1 MG/ML IJ SOLN
INTRAMUSCULAR | Status: AC
  Administered 2015-05-25: 0.5 mg via INTRAVENOUS
  Filled 2015-05-25: qty 1

## 2015-05-25 MED ORDER — DEXAMETHASONE SODIUM PHOSPHATE 10 MG/ML IJ SOLN
INTRAMUSCULAR | Status: DC | PRN
  Administered 2015-05-25: 10 mg via INTRAVENOUS

## 2015-05-25 MED ORDER — FENTANYL CITRATE (PF) 250 MCG/5ML IJ SOLN
INTRAMUSCULAR | Status: AC
  Filled 2015-05-25: qty 5

## 2015-05-25 MED ORDER — OXYCODONE HCL 5 MG PO TABS
5.0000 mg | ORAL_TABLET | ORAL | Status: DC | PRN
  Administered 2015-05-26 (×3): 10 mg via ORAL
  Filled 2015-05-25 (×3): qty 2

## 2015-05-25 MED ORDER — ROCURONIUM BROMIDE 100 MG/10ML IV SOLN
INTRAVENOUS | Status: DC | PRN
  Administered 2015-05-25: 50 mg via INTRAVENOUS

## 2015-05-25 MED ORDER — ONDANSETRON HCL 4 MG/2ML IJ SOLN
INTRAMUSCULAR | Status: AC
  Administered 2015-05-25: 4 mg via INTRAVENOUS
  Filled 2015-05-25: qty 2

## 2015-05-25 MED ORDER — DEXTROSE-NACL 5-0.9 % IV SOLN
INTRAVENOUS | Status: DC
Start: 2015-05-25 — End: 2015-05-26
  Administered 2015-05-25: 21:00:00 via INTRAVENOUS

## 2015-05-25 MED ORDER — LIDOCAINE HCL (CARDIAC) 20 MG/ML IV SOLN
INTRAVENOUS | Status: DC | PRN
  Administered 2015-05-25: 70 mg via INTRAVENOUS

## 2015-05-25 MED ORDER — SUGAMMADEX SODIUM 200 MG/2ML IV SOLN
INTRAVENOUS | Status: AC
  Filled 2015-05-25: qty 2

## 2015-05-25 MED ORDER — FENTANYL CITRATE (PF) 100 MCG/2ML IJ SOLN
INTRAMUSCULAR | Status: DC | PRN
  Administered 2015-05-25: 25 ug via INTRAVENOUS
  Administered 2015-05-25: 75 ug via INTRAVENOUS
  Administered 2015-05-25 (×3): 50 ug via INTRAVENOUS
  Administered 2015-05-25: 100 ug via INTRAVENOUS
  Administered 2015-05-25 (×3): 50 ug via INTRAVENOUS

## 2015-05-25 MED ORDER — PHENYLEPHRINE HCL 10 MG/ML IJ SOLN
INTRAMUSCULAR | Status: DC | PRN
  Administered 2015-05-25: 80 ug via INTRAVENOUS

## 2015-05-25 MED ORDER — EPHEDRINE SULFATE 50 MG/ML IJ SOLN
INTRAMUSCULAR | Status: DC | PRN
  Administered 2015-05-25: 10 mg via INTRAVENOUS
  Administered 2015-05-25: 5 mg via INTRAVENOUS

## 2015-05-25 MED ORDER — SUGAMMADEX SODIUM 200 MG/2ML IV SOLN
INTRAVENOUS | Status: DC | PRN
  Administered 2015-05-25: 200 mg via INTRAVENOUS

## 2015-05-25 MED ORDER — METHOCARBAMOL 500 MG PO TABS: 500.0000 mg | ORAL_TABLET | Freq: Four times a day (QID) | ORAL | Status: DC | PRN

## 2015-05-25 MED ORDER — ONDANSETRON HCL 4 MG/2ML IJ SOLN
4.0000 mg | Freq: Four times a day (QID) | INTRAMUSCULAR | Status: DC | PRN
  Administered 2015-05-25: 4 mg via INTRAVENOUS

## 2015-05-25 MED ORDER — HYDROMORPHONE HCL 1 MG/ML IJ SOLN
1.0000 mg | INTRAMUSCULAR | Status: DC | PRN
  Administered 2015-05-25: 1 mg via INTRAVENOUS
  Filled 2015-05-25: qty 1

## 2015-05-25 MED ORDER — 0.9 % SODIUM CHLORIDE (POUR BTL) OPTIME
TOPICAL | Status: DC | PRN
  Administered 2015-05-25: 1000 mL

## 2015-05-25 MED ORDER — ONDANSETRON 4 MG PO TBDP: 4.0000 mg | ORAL_TABLET | Freq: Four times a day (QID) | ORAL | Status: DC | PRN

## 2015-05-25 MED ORDER — ONDANSETRON HCL 4 MG/2ML IJ SOLN
INTRAMUSCULAR | Status: DC | PRN
  Administered 2015-05-25: 4 mg via INTRAVENOUS

## 2015-05-25 MED ORDER — METHOCARBAMOL 500 MG PO TABS
500.0000 mg | ORAL_TABLET | Freq: Four times a day (QID) | ORAL | Status: DC
  Administered 2015-05-25 – 2015-05-26 (×2): 500 mg via ORAL
  Filled 2015-05-25 (×2): qty 1

## 2015-05-25 MED ORDER — STERILE WATER FOR INJECTION IJ SOLN
INTRAMUSCULAR | Status: AC
  Filled 2015-05-25: qty 10

## 2015-05-25 MED ORDER — POLYETHYLENE GLYCOL 3350 17 G PO PACK: 17.0000 g | PACK | Freq: Every day | ORAL | Status: DC | PRN

## 2015-05-25 MED ORDER — BUPIVACAINE-EPINEPHRINE 0.25% -1:200000 IJ SOLN
INTRAMUSCULAR | Status: DC | PRN
  Administered 2015-05-25: 4 mL

## 2015-05-25 MED ORDER — HYDROMORPHONE HCL 1 MG/ML IJ SOLN
0.2500 mg | INTRAMUSCULAR | Status: DC | PRN
  Administered 2015-05-25 (×4): 0.5 mg via INTRAVENOUS

## 2015-05-25 MED ORDER — PROMETHAZINE HCL 25 MG/ML IJ SOLN: 6.2500 mg | INTRAMUSCULAR | Status: DC | PRN

## 2015-05-25 MED ORDER — PROPOFOL 10 MG/ML IV BOLUS
INTRAVENOUS | Status: AC
  Filled 2015-05-25: qty 20

## 2015-05-25 MED ORDER — VECURONIUM BROMIDE 10 MG IV SOLR
INTRAVENOUS | Status: DC | PRN
  Administered 2015-05-25: 3 mg via INTRAVENOUS
  Administered 2015-05-25: 2 mg via INTRAVENOUS
  Administered 2015-05-25: 1 mg via INTRAVENOUS
  Administered 2015-05-25: 2 mg via INTRAVENOUS

## 2015-05-25 MED ORDER — ENOXAPARIN SODIUM 40 MG/0.4ML ~~LOC~~ SOLN
40.0000 mg | SUBCUTANEOUS | Status: DC
  Administered 2015-05-26: 40 mg via SUBCUTANEOUS
  Filled 2015-05-25: qty 0.4

## 2015-05-25 MED ORDER — CEFAZOLIN SODIUM-DEXTROSE 2-3 GM-% IV SOLR
2.0000 g | Freq: Three times a day (TID) | INTRAVENOUS | Status: AC
  Administered 2015-05-25: 2 g via INTRAVENOUS
  Filled 2015-05-25: qty 50

## 2015-05-25 MED ORDER — ZOLPIDEM TARTRATE 5 MG PO TABS: 5.0000 mg | ORAL_TABLET | Freq: Every evening | ORAL | Status: DC | PRN

## 2015-05-25 SURGICAL SUPPLY — 55 items
BLADE SURG ROTATE 9660 (MISCELLANEOUS) IMPLANT
CANISTER SUCTION 2500CC (MISCELLANEOUS) ×2 IMPLANT
CHLORAPREP W/TINT 26ML (MISCELLANEOUS) ×2 IMPLANT
COVER SURGICAL LIGHT HANDLE (MISCELLANEOUS) ×2 IMPLANT
DEVICE SECURE STRAP 25 ABSORB (INSTRUMENTS) ×2 IMPLANT
DRAIN CHANNEL 19F RND (DRAIN) ×2 IMPLANT
DRAIN PENROSE 1/2X12 LTX STRL (WOUND CARE) IMPLANT
DRAPE LAPAROTOMY T 102X78X121 (DRAPES) ×2 IMPLANT
DRAPE LAPAROTOMY TRNSV 102X78 (DRAPE) ×2 IMPLANT
DRAPE UTILITY XL STRL (DRAPES) ×2 IMPLANT
DRSG OPSITE POSTOP 4X10 (GAUZE/BANDAGES/DRESSINGS) ×2 IMPLANT
ELECT BLADE 4.0 EZ CLEAN MEGAD (MISCELLANEOUS) ×2
ELECT CAUTERY BLADE 6.4 (BLADE) ×2 IMPLANT
ELECT REM PT RETURN 9FT ADLT (ELECTROSURGICAL) ×2
ELECTRODE BLDE 4.0 EZ CLN MEGD (MISCELLANEOUS) ×1 IMPLANT
ELECTRODE REM PT RTRN 9FT ADLT (ELECTROSURGICAL) ×1 IMPLANT
EVACUATOR SILICONE 100CC (DRAIN) ×2 IMPLANT
GAUZE SPONGE 4X4 12PLY STRL (GAUZE/BANDAGES/DRESSINGS) ×2 IMPLANT
GLOVE BIO SURGEON STRL SZ8 (GLOVE) ×4 IMPLANT
GLOVE BIOGEL PI IND STRL 8 (GLOVE) ×2 IMPLANT
GLOVE BIOGEL PI INDICATOR 8 (GLOVE) ×2
GOWN STRL REUS W/ TWL LRG LVL3 (GOWN DISPOSABLE) ×1 IMPLANT
GOWN STRL REUS W/ TWL XL LVL3 (GOWN DISPOSABLE) ×3 IMPLANT
GOWN STRL REUS W/TWL LRG LVL3 (GOWN DISPOSABLE) ×2
GOWN STRL REUS W/TWL XL LVL3 (GOWN DISPOSABLE) ×3
KIT BASIN OR (CUSTOM PROCEDURE TRAY) ×2 IMPLANT
KIT ROOM TURNOVER OR (KITS) ×2 IMPLANT
LIQUID BAND (GAUZE/BANDAGES/DRESSINGS) ×2 IMPLANT
MESH VENT ST 15.5X25.7CM MDLN (Mesh General) ×2 IMPLANT
NEEDLE HYPO 25GX1X1/2 BEV (NEEDLE) ×2 IMPLANT
NS IRRIG 1000ML POUR BTL (IV SOLUTION) ×2 IMPLANT
PACK SURGICAL SETUP 50X90 (CUSTOM PROCEDURE TRAY) ×2 IMPLANT
PAD ARMBOARD 7.5X6 YLW CONV (MISCELLANEOUS) ×4 IMPLANT
PENCIL BUTTON HOLSTER BLD 10FT (ELECTRODE) ×2 IMPLANT
SPONGE INTESTINAL PEANUT (DISPOSABLE) ×2 IMPLANT
SPONGE LAP 18X18 X RAY DECT (DISPOSABLE) ×4 IMPLANT
SUT ETHILON 2 0 FS 18 (SUTURE) ×2 IMPLANT
SUT MNCRL AB 4-0 PS2 18 (SUTURE) ×2 IMPLANT
SUT NOVA NAB DX-16 0-1 5-0 T12 (SUTURE) ×2 IMPLANT
SUT PDS AB 1 CT  36 (SUTURE) ×2
SUT PDS AB 1 CT 36 (SUTURE) ×2 IMPLANT
SUT SILK 2 0 SH (SUTURE) IMPLANT
SUT VIC AB 0 CT1 27 (SUTURE) ×4
SUT VIC AB 0 CT1 27XBRD ANBCTR (SUTURE) ×2 IMPLANT
SUT VIC AB 2-0 SH 27 (SUTURE) ×2
SUT VIC AB 2-0 SH 27X BRD (SUTURE) ×1 IMPLANT
SUT VIC AB 3-0 SH 18 (SUTURE) ×2 IMPLANT
SUT VICRYL AB 3 0 TIES (SUTURE) ×2 IMPLANT
SYR BULB 3OZ (MISCELLANEOUS) ×2 IMPLANT
SYR CONTROL 10ML LL (SYRINGE) ×2 IMPLANT
TACKER 5MM HERNIA 3.5CML NAB (ENDOMECHANICALS) IMPLANT
TOWEL OR 17X24 6PK STRL BLUE (TOWEL DISPOSABLE) ×2 IMPLANT
TOWEL OR 17X26 10 PK STRL BLUE (TOWEL DISPOSABLE) ×2 IMPLANT
TUBE CONNECTING 12X1/4 (SUCTIONS) ×2 IMPLANT
YANKAUER SUCT BULB TIP NO VENT (SUCTIONS) ×2 IMPLANT

## 2015-05-25 NOTE — Anesthesia Postprocedure Evaluation (Signed)
Anesthesia Post Note  Patient: Margaret Cruz  Procedure(s) Performed: Procedure(s) (LRB): HERNIA REPAIR RIGHT FLANK  ADULT WITH MESH (Right) INSERTION OF MESH (Right)  Patient location during evaluation: PACU Anesthesia Type: General Level of consciousness: awake and alert Pain management: pain level controlled Vital Signs Assessment: post-procedure vital signs reviewed and stable Respiratory status: spontaneous breathing, nonlabored ventilation, respiratory function stable and patient connected to nasal cannula oxygen Cardiovascular status: blood pressure returned to baseline and stable Postop Assessment: no signs of nausea or vomiting Anesthetic complications: no    Last Vitals:  Filed Vitals:   05/25/15 1804 05/25/15 1843  BP: 112/61 120/60  Pulse: 63 70  Temp: 36.6 C 36.5 C  Resp: 17 18    Last Pain:  Filed Vitals:   05/25/15 1844  PainSc: 5                  Yuriy Cui L
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
## 2015-05-25 NOTE — Transfer of Care (Signed)
Immediate Anesthesia Transfer of Care Note  Patient: Margaret Cruz  Procedure(s) Performed: Procedure(s): HERNIA REPAIR RIGHT FLANK  ADULT WITH MESH (Right) INSERTION OF MESH (Right)  Patient Location: PACU  Anesthesia Type:General  Level of Consciousness: awake, oriented, sedated, patient cooperative and responds to stimulation  Airway & Oxygen Therapy: Patient Spontanous Breathing and Patient connected to face mask oxygen  Post-op Assessment: Report given to RN, Post -op Vital signs reviewed and stable, Patient moving all extremities and Patient moving all extremities X 4  Post vital signs: Reviewed and stable  Last Vitals:  Filed Vitals:   05/25/15 1124 05/25/15 1604  BP: 134/65 129/76  Pulse: 72 94  Temp: 36.4 C 35.9 C  Resp: 18 19    Complications: No apparent anesthesia complications

## 2015-05-25 NOTE — Interval H&P Note (Signed)
History and Physical Interval Note:  05/25/2015 12:14 PM  Margaret Cruz  has presented today for surgery, with the diagnosis of Right flank traumatic hernia  The various methods of treatment have been discussed with the patient and family. After consideration of risks, benefits and other options for treatment, the patient has consented to  Procedure(s): HERNIA REPAIR RIGHT FLANK  ADULT WITH MESH (Right) INSERTION OF MESH (Right) as a surgical intervention .  The patient's history has been reviewed, patient examined, no change in status, stable for surgery.  I have reviewed the patient's chart and labs.  Questions were answered to the patient's satisfaction.     Byan Poplaski A.

## 2015-05-25 NOTE — Anesthesia Preprocedure Evaluation (Signed)
Anesthesia Evaluation  Patient identified by MRN, date of birth, ID band Patient awake    Reviewed: Allergy & Precautions, NPO status , Patient's Chart, lab work & pertinent test results  History of Anesthesia Complications (+) PONV  Airway Mallampati: II  TM Distance: >3 FB Neck ROM: Full    Dental   Pulmonary shortness of breath,    breath sounds clear to auscultation       Cardiovascular negative cardio ROS   Rhythm:Regular Rate:Normal     Neuro/Psych    GI/Hepatic Neg liver ROS, GERD  ,  Endo/Other  negative endocrine ROS  Renal/GU negative Renal ROS     Musculoskeletal  (+) Arthritis ,   Abdominal   Peds  Hematology   Anesthesia Other Findings   Reproductive/Obstetrics                             Anesthesia Physical Anesthesia Plan  ASA: III  Anesthesia Plan: General   Post-op Pain Management:    Induction: Intravenous  Airway Management Planned: Oral ETT  Additional Equipment:   Intra-op Plan:   Post-operative Plan: Extubation in OR  Informed Consent: I have reviewed the patients History and Physical, chart, labs and discussed the procedure including the risks, benefits and alternatives for the proposed anesthesia with the patient or authorized representative who has indicated his/her understanding and acceptance.   Dental advisory given  Plan Discussed with: CRNA and Anesthesiologist  Anesthesia Plan Comments:         Anesthesia Quick Evaluation

## 2015-05-25 NOTE — Anesthesia Procedure Notes (Signed)
Procedure Name: Intubation Date/Time: 05/25/2015 1:03 PM Performed by: Merrilyn Puma B Pre-anesthesia Checklist: Patient identified, Emergency Drugs available, Suction available, Patient being monitored and Timeout performed Patient Re-evaluated:Patient Re-evaluated prior to inductionOxygen Delivery Method: Circle system utilized Preoxygenation: Pre-oxygenation with 100% oxygen Intubation Type: IV induction and Cricoid Pressure applied Ventilation: Mask ventilation without difficulty and Oral airway inserted - appropriate to patient size Laryngoscope Size: Mac and 3 Grade View: Grade I Tube type: Oral Tube size: 7.5 mm Number of attempts: 1 Airway Equipment and Method: Stylet Placement Confirmation: breath sounds checked- equal and bilateral,  CO2 detector,  positive ETCO2 and ETT inserted through vocal cords under direct vision Secured at: 21 cm Tube secured with: Tape Dental Injury: Teeth and Oropharynx as per pre-operative assessment

## 2015-05-26 ENCOUNTER — Encounter (HOSPITAL_COMMUNITY): Payer: Self-pay | Admitting: Surgery

## 2015-05-26 LAB — COMPREHENSIVE METABOLIC PANEL
ALBUMIN: 2.6 g/dL — AB (ref 3.5–5.0)
ALT: 13 U/L — ABNORMAL LOW (ref 14–54)
ANION GAP: 8 (ref 5–15)
AST: 14 U/L — AB (ref 15–41)
Alkaline Phosphatase: 61 U/L (ref 38–126)
BILIRUBIN TOTAL: 1.4 mg/dL — AB (ref 0.3–1.2)
BUN: <5 mg/dL — ABNORMAL LOW (ref 6–20)
CHLORIDE: 104 mmol/L (ref 101–111)
CO2: 28 mmol/L (ref 22–32)
CREATININE: 0.62 mg/dL (ref 0.44–1.00)
Calcium: 8.4 mg/dL — ABNORMAL LOW (ref 8.9–10.3)
GFR calc non Af Amer: >60 mL/min (ref >60)
Glucose, Bld: 105 mg/dL — ABNORMAL HIGH (ref 65–99)
POTASSIUM: 4.1 mmol/L (ref 3.5–5.1)
SODIUM: 140 mmol/L (ref 135–145)
Total Protein: 5.1 g/dL — ABNORMAL LOW (ref 6.5–8.1)

## 2015-05-26 LAB — CBC
HCT: 38 % (ref 36.0–46.0)
HEMOGLOBIN: 12.3 g/dL (ref 12.0–15.0)
MCH: 29.1 pg (ref 26.0–34.0)
MCHC: 32.4 g/dL (ref 30.0–36.0)
MCV: 89.8 fL (ref 78.0–100.0)
PLATELETS: 225 10*3/uL (ref 150–400)
RBC: 4.23 MIL/uL (ref 3.87–5.11)
RDW: 14.1 % (ref 11.5–15.5)
WBC: 9.3 10*3/uL (ref 4.0–10.5)

## 2015-05-26 MED ORDER — SODIUM CHLORIDE 0.9% FLUSH: 3.0000 mL | Freq: Two times a day (BID) | INTRAVENOUS | Status: DC

## 2015-05-26 MED ORDER — SODIUM CHLORIDE 0.9 % IV SOLN: 250.0000 mL | INTRAVENOUS | Status: DC | PRN

## 2015-05-26 MED ORDER — POLYETHYLENE GLYCOL 3350 17 G PO PACK: 17.0000 g | PACK | Freq: Every day | ORAL | Status: DC | PRN

## 2015-05-26 MED ORDER — OXYCODONE HCL 5 MG PO TABS: 5.0000 mg | ORAL_TABLET | ORAL | Status: DC | PRN

## 2015-05-26 MED ORDER — ONDANSETRON 4 MG PO TBDP: 4.0000 mg | ORAL_TABLET | Freq: Four times a day (QID) | ORAL | Status: DC | PRN

## 2015-05-26 MED ORDER — SODIUM CHLORIDE 0.9% FLUSH: 3.0000 mL | INTRAVENOUS | Status: DC | PRN

## 2015-05-26 NOTE — Progress Notes (Signed)
1 Day Post-Op  Subjective: Doing well Pain well controlled  Ambulating   Objective: Vital signs in last 24 hours: Temp:  [96.6 F (35.9 C)-98.3 F (36.8 C)] 97.6 F (36.4 C) (02/09 0525) Pulse Rate:  [63-111] 70 (02/09 0525) Resp:  [13-21] 18 (02/09 0525) BP: (101-134)/(56-84) 105/57 mmHg (02/09 0525) SpO2:  [93 %-100 %] 97 % (02/09 0525) Weight:  [97.841 kg (215 lb 11.2 oz)-104 kg (229 lb 4.5 oz)] 104 kg (229 lb 4.5 oz) (02/08 1843) Last BM Date: 05/25/15  Intake/Output from previous day: 02/08 0701 - 02/09 0700 In: 3629 [I.V.:3629] Out: 1975 [Urine:1600; Drains:175; Blood:200] Intake/Output this shift:    Incision/Wound:dry dressing  JP serous   Lab Results:   Recent Labs  05/24/15 0858  WBC 11.2*  HGB 14.7  HCT 44.7  PLT 267   BMET  Recent Labs  05/24/15 0858  NA 137  K 4.6  CL 103  CO2 23  GLUCOSE 121*  BUN <5*  CREATININE 0.69  CALCIUM 9.4   PT/INR No results for input(s): LABPROT, INR in the last 72 hours. ABG No results for input(s): PHART, HCO3 in the last 72 hours.  Invalid input(s): PCO2, PO2  Studies/Results: No results found.  Anti-infectives: Anti-infectives    Start     Dose/Rate Route Frequency Ordered Stop   05/25/15 2100  ceFAZolin (ANCEF) IVPB 2 g/50 mL premix     2 g 100 mL/hr over 30 Minutes Intravenous 3 times per day 05/25/15 1842 05/25/15 2137   05/25/15 1100  ceFAZolin (ANCEF) 3 g in dextrose 5 % 50 mL IVPB     3 g 130 mL/hr over 30 Minutes Intravenous To [facility] 05/24/15 1053 05/25/15 1335      Assessment/Plan: s/p Procedure(s): HERNIA REPAIR RIGHT FLANK  ADULT WITH MESH (Right) INSERTION OF MESH (Right) Advance diet Home later today if ok with po pain meds  Follow up 2 weeks  Drain care   LOS: 1 day    Noal Abshier A. 05/26/2015

## 2015-05-26 NOTE — Discharge Summary (Signed)
Physician Discharge Summary  Patient ID: Margaret Cruz MRN: [id number] DOB/AGE: 1970-10-18 45 y.o.  Admit date: 05/25/2015 Discharge date: 05/26/2015  Admission Diagnoses:traumatic hernia abdomen  / ventral hernia   Discharge Diagnoses:  Active Problems:   Ventral hernia   Discharged Condition: good  Hospital Course: unremarkable  Pt had food pain control and was tolerating her diet. She was ambulating well.  No fever or chills and JP output serous and 200 cc total   Incision CDI with dry dressing   Consults: None    Treatments: surgery: ventral hernia repair with mesh   Discharge Exam: Blood pressure 105/57, pulse 70, temperature 97.6 F (36.4 C), temperature source Oral, resp. rate 18, height 5\' 5"  (1.651 m), weight 104 kg (229 lb 4.5 oz), SpO2 97 %. Incision/Wound:CDI dressing dry   Disposition: 01-Home or Self Care  Discharge Instructions    Diet - low sodium heart healthy    Complete by:  As directed      Increase activity slowly    Complete by:  As directed             Medication List    TAKE these medications        meloxicam 15 MG tablet  Commonly known as:  MOBIC  Take 15 mg by mouth daily.     methocarbamol 500 MG tablet  Commonly known as:  ROBAXIN  Take 1 tablet (500 mg total) by mouth 4 (four) times daily.     ondansetron 4 MG disintegrating tablet  Commonly known as:  ZOFRAN-ODT  Take 1 tablet (4 mg total) by mouth every 6 (six) hours as needed for nausea.     oxyCODONE 5 MG immediate release tablet  Commonly known as:  Oxy IR/ROXICODONE  Take 1-2 tablets (5-10 mg total) by mouth every 4 (four) hours as needed for moderate pain.     polyethylene glycol packet  Commonly known as:  MIRALAX / GLYCOLAX  Take 17 g by mouth daily as needed for mild constipation.     pramipexole 1.5 MG tablet  Commonly known as:  MIRAPEX  Take 1 tablet (1.5 mg total) by mouth 3 (three) times daily.     zolpidem 5 MG tablet  Commonly known as:  AMBIEN   Take 1 tablet (5 mg total) by mouth at bedtime as needed for sleep.           Follow-up Information    Follow up with Antanasia Kaczynski A., MD In 2 weeks.   Specialty:  General Surgery   Contact information:   [address] [place] [address] [phone]       Signed: Turner Daniels. 05/26/2015, 7:15 AM

## 2015-05-26 NOTE — Discharge Instructions (Signed)
[facility] _______[facility] ° °UMBILICAL OR INGUINAL HERNIA REPAIR: POST OP INSTRUCTIONS ° °Always review your discharge instruction sheet given to you by the facility where your surgery was performed. °IF YOU HAVE DISABILITY OR FAMILY LEAVE FORMS, YOU MUST BRING THEM TO THE OFFICE FOR PROCESSING.   °DO NOT GIVE THEM TO YOUR DOCTOR. ° °1. A  prescription for pain medication may be given to you upon discharge.  Take your pain medication as prescribed, if needed.  If narcotic pain medicine is not needed, then you may take acetaminophen (Tylenol) or ibuprofen (Advil) as needed. °2. Take your usually prescribed medications unless otherwise directed. °3. If you need a refill on your pain medication, please contact your pharmacy.  They will contact our office to request authorization. Prescriptions will not be filled after 5 pm or on week-ends. °4. You should follow a light diet the first 24 hours after arrival home, such as soup and crackers, etc.  Be sure to include lots of fluids daily.  Resume your normal diet the day after surgery. °5. Most patients will experience some swelling and bruising around the umbilicus or in the groin and scrotum.  Ice packs and reclining will help.  Swelling and bruising can take several days to resolve.  °6. It is common to experience some constipation if taking pain medication after surgery.  Increasing fluid intake and taking a stool softener (such as Colace) will usually help or prevent this problem from occurring.  A mild laxative (Milk of Magnesia or Miralax) should be taken according to package directions if there are no bowel movements after 48 hours. °7. Unless discharge instructions indicate otherwise, you may remove your bandages 24-48 hours after surgery, and you may shower at that time.  You may have steri-strips (small skin tapes) in place directly over the incision.  These strips should be left on the skin for 7-10 days.  If your surgeon used skin glue on the  incision, you may shower in 24 hours.  The glue will flake off over the next 2-3 weeks.  Any sutures or staples will be removed at the office during your follow-up visit. °8. ACTIVITIES:  You may resume regular (light) daily activities beginning the next day--such as daily self-care, walking, climbing stairs--gradually increasing activities as tolerated.  You may have sexual intercourse when it is comfortable.  Refrain from any heavy lifting or straining until approved by your doctor. °a. You may drive when you are no longer taking prescription pain medication, you can comfortably wear a seatbelt, and you can safely maneuver your car and apply brakes. °b. RETURN TO WORK:  __________________________________________________________ °9. You should see your doctor in the office for a follow-up appointment approximately 2-3 weeks after your surgery.  Make sure that you call for this appointment within a day or two after you arrive home to insure a convenient appointment time. °10. OTHER INSTRUCTIONS:  __________________________________________________________________________________________________________________________________________________________________________________________  °WHEN TO CALL YOUR DOCTOR: °1. Fever over 101.0 °2. Inability to urinate °3. Nausea and/or vomiting °4. Extreme swelling or bruising °5. Continued bleeding from incision. °6. Increased pain, redness, or drainage from the incision ° °The clinic staff is available to answer your questions during regular business hours.  Please don’t hesitate to call and ask to speak to one of the nurses for clinical concerns.  If you have a medical emergency, go to the nearest emergency room or call 911.  A surgeon from [facility] is always on call at the hospital ° ° °  [address] [place], [place], [place]  [address] ?  [address], [place], [place]   [address] [phone] ? [phone] ? FAX [phone] Web site:  [email or web address]        Bulb Drain Home Care A bulb drain consists of a thin rubber tube and a soft, round bulb that creates a gentle suction. The rubber tube is placed in the area where you had surgery. A bulb is attached to the end of the tube that is outside the body. The bulb drain removes excess fluid that normally builds up in a surgical wound after surgery. The color and amount of fluid will vary. Immediately after surgery, the fluid is bright red and is a little thicker than water. It may gradually change to a yellow or pink color and become more thin and water-like. When the amount decreases to about 1 or 2 tbsp in 24 hours, your health care provider will usually remove it. DAILY CARE  Keep the bulb flat (compressed) at all times, except while emptying it. The flatness creates suction. You can flatten the bulb by squeezing it firmly in the middle and then closing the cap.  Keep sites where the tube enters the skin dry and covered with a bandage (dressing).  Secure the tube 1-2 in (2.5-5.1 cm) below the insertion sites to keep it from pulling on your stitches. The tube is stitched in place and will not slip out.  Secure the bulb as directed by your health care provider.  For the first 3 days after surgery, there usually is more fluid in the bulb. Empty the bulb whenever it becomes half full because the bulb does not create enough suction if it is too full. The bulb could also overflow. Write down how much fluid you remove each time you empty your drain. Add up the amount removed in 24 hours.  Empty the bulb at the same time every day once the amount of fluid decreases and you only need to empty it once a day. Write down the amounts and the 24-hour totals to give to your health care provider. This helps your health care provider know when the tubes can be removed. EMPTYING THE BULB DRAIN Before emptying the bulb, get a measuring cup, a piece of paper and a pen, and wash  your hands.  Gently run your fingers down the tube (stripping) to empty any drainage from the tubing into the bulb. This may need to be done several times a day to clear the tubing of clots and tissue.  Open the bulb cap to release suction, which causes it to inflate. Do not touch the inside of the cap.  Gently run your fingers down the tube (stripping) to empty any drainage from the tubing into the bulb.  Hold the cap out of the way, and pour fluid into the measuring cup.   Squeeze the bulb to provide suction.  Replace the cap.   Check the tape that holds the tube to your skin. If it is becoming loose, you can remove the loose piece of tape and apply a new one. Then, pin the bulb to your shirt.   Write down the amount of fluid you emptied out. Write down the date and each time you emptied your bulb drain. (If there are 2 bulbs, note the amount of drainage from each bulb and keep the totals separate. Your health care provider will want to know the total amounts for each drain and which tube is  draining more.)   Flush the fluid down the toilet and wash your hands.   Call your health care provider once you have less than 2 tbsp of fluid collecting in the bulb drain every 24 hours. If there is drainage around the tube site, change dressings and keep the area dry. Cleanse around tube with sterile saline and place dry gauze around site. This gauze should be changed when it is soiled. If it stays clean and unsoiled, it should still be changed daily.  SEEK MEDICAL CARE IF:  Your drainage has a bad smell or is cloudy.   You have a fever.   Your drainage is increasing instead of decreasing.   Your tube fell out.   You have redness or swelling around the tube site.   You have drainage from a surgical wound.   Your bulb drain will not stay flat after you empty it.  MAKE SURE YOU:   Understand these instructions.  Will watch your condition.  Will get help right away if you  are not doing well or get worse.   This information is not intended to replace advice given to you by your health care provider. Make sure you discuss any questions you have with your health care provider.   Document Released: 03/30/2000 Document Revised: 04/23/2014 Document Reviewed: 10/20/2014 Elsevier Interactive Patient Education 2016 [place] dressing in 5 days Ok to shower  Now Cover with gauze daily and apply neosporin to staples once dressing removed.

## 2015-05-26 NOTE — Progress Notes (Signed)
AVS and rx given to patient who verbalizes understanding. JP instruction done and sheet given to track output.  Dc to private car home with all personal belongings accompanied by family at 53.

## 2015-06-20 ENCOUNTER — Encounter: Payer: Self-pay | Admitting: Nurse Practitioner

## 2015-06-20 ENCOUNTER — Ambulatory Visit (INDEPENDENT_AMBULATORY_CARE_PROVIDER_SITE_OTHER): Payer: BLUE CROSS/BLUE SHIELD | Admitting: Nurse Practitioner

## 2015-06-20 VITALS — BP 115/71 | HR 63 | Temp 97.4°F | Ht 65.0 in | Wt 221.0 lb

## 2015-06-20 DIAGNOSIS — Z6836 Body mass index (BMI) 36.0-36.9, adult: Secondary | ICD-10-CM | POA: Diagnosis not present

## 2015-06-20 MED ORDER — PHENTERMINE HCL 37.5 MG PO TABS: 37.5000 mg | ORAL_TABLET | Freq: Every day | ORAL | Status: DC

## 2015-06-20 NOTE — Patient Instructions (Signed)
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
## 2015-09-06 ENCOUNTER — Telehealth: Payer: Self-pay | Admitting: Nurse Practitioner

## 2015-09-06 DIAGNOSIS — Z1231 Encounter for screening mammogram for malignant neoplasm of breast: Secondary | ICD-10-CM

## 2015-09-06 NOTE — Telephone Encounter (Signed)
Had ct on 02/11/15 and they did not say anything about spot on liver. DO you still want to repeat

## 2015-09-06 NOTE — Telephone Encounter (Signed)
Patient aware referral placed for mammogram.

## 2015-09-07 NOTE — Telephone Encounter (Signed)
Pt is aware and declines the CT.

## 2015-09-16 ENCOUNTER — Ambulatory Visit (HOSPITAL_COMMUNITY)
Admission: RE | Admit: 2015-09-16 | Discharge: 2015-09-16 | Disposition: A | Discharge status: Home / Self Care | Payer: BLUE CROSS/BLUE SHIELD | Source: Ambulatory Visit | Attending: Nurse Practitioner | Admitting: Nurse Practitioner

## 2015-09-16 DIAGNOSIS — Z1231 Encounter for screening mammogram for malignant neoplasm of breast: Secondary | ICD-10-CM

## 2015-09-22 ENCOUNTER — Encounter: Payer: Self-pay | Admitting: Nurse Practitioner

## 2015-09-22 ENCOUNTER — Other Ambulatory Visit: Payer: Self-pay | Admitting: Nurse Practitioner

## 2015-12-01 ENCOUNTER — Telehealth: Payer: Self-pay | Admitting: Nurse Practitioner

## 2015-12-01 NOTE — Telephone Encounter (Signed)
Call was handled with no message needed

## 2015-12-07 ENCOUNTER — Encounter (HOSPITAL_COMMUNITY): Payer: Self-pay

## 2015-12-07 ENCOUNTER — Other Ambulatory Visit (HOSPITAL_COMMUNITY): Payer: Self-pay | Admitting: Family

## 2015-12-07 NOTE — Pre-Procedure Instructions (Signed)
Margaret Cruz  12/07/2015      [facility] [address] [place] [address] Phone: [phone] Fax: [facility] [address], [place] [address] Phone: [phone] Fax: [phone]    Your procedure is scheduled on September 13  Report to [facility] at [place].M.  Call this number if you have problems the morning of surgery:  [phone]   Remember:  Do not eat food or drink liquids after midnight.   Take these medicines the morning of surgery with A SIP OF WATER tylenol if needed   7 days prior to surgery STOP taking any Aspirin, Aleve, Naproxen, Ibuprofen, Motrin, Advil, Goody's, BC's, all herbal medications, fish oil, and all vitamins    Do not wear jewelry, make-up or nail polish.  Do not wear lotions, powders, or perfumes, or deoderant.  Do not shave 48 hours prior to surgery.   Do not bring valuables to the hospital.  [facility] is not responsible for any belongings or valuables.  Contacts, dentures or bridgework may not be worn into surgery.  Leave your suitcase in the car.  After surgery it may be brought to your room.  For patients admitted to the hospital, discharge time will be determined by your treatment team.  Patients discharged the day of surgery will not be allowed to drive home.     [facility]- Preparing For Surgery  Before surgery, you can play an important role. Because skin is not sterile, your skin needs to be as free of germs as possible. You can reduce the number of germs on your skin by washing with CHG (chlorahexidine gluconate) Soap before surgery.  CHG is an antiseptic cleaner which kills germs and bonds with the skin to continue killing germs even after washing.  Please do not use if you have an allergy to CHG or antibacterial soaps. If your skin becomes reddened/irritated stop using the CHG.  Do not shave  (including legs and underarms) for at least 48 hours prior to first CHG shower. It is OK to shave your face.  Please follow these instructions carefully.   1. Shower the NIGHT BEFORE SURGERY and the MORNING OF SURGERY with CHG.   2. If you chose to wash your hair, wash your hair first as usual with your normal shampoo.  3. After you shampoo, rinse your hair and body thoroughly to remove the shampoo.  4. Use CHG as you would any other liquid soap. You can apply CHG directly to the skin and wash gently with a scrungie or a clean washcloth.   5. Apply the CHG Soap to your body ONLY FROM THE NECK DOWN.  Do not use on open wounds or open sores. Avoid contact with your eyes, ears, mouth and genitals (private parts). Wash genitals (private parts) with your normal soap.  6. Wash thoroughly, paying special attention to the area where your surgery will be performed.  7. Thoroughly rinse your body with warm water from the neck down.  8. DO NOT shower/wash with your normal soap after using and rinsing off the CHG Soap.  9. Pat yourself dry with a CLEAN TOWEL.   10. Wear CLEAN PAJAMAS   11. Place CLEAN SHEETS on your bed the night of your first shower and DO NOT SLEEP WITH PETS.    Day of Surgery: Do not apply  any deodorants/lotions. Please wear clean clothes to the hospital/surgery center.      Please read over the following fact sheets that you were given. Pain Booklet, Coughing and Deep Breathing and Surgical Site Infection Prevention

## 2015-12-08 ENCOUNTER — Encounter (HOSPITAL_COMMUNITY): Payer: Self-pay

## 2015-12-08 ENCOUNTER — Encounter (HOSPITAL_COMMUNITY)
Admission: RE | Admit: 2015-12-08 | Discharge: 2015-12-08 | Disposition: A | Discharge status: Home / Self Care | Payer: BLUE CROSS/BLUE SHIELD | Source: Ambulatory Visit | Attending: Orthopedic Surgery | Admitting: Orthopedic Surgery

## 2015-12-08 DIAGNOSIS — M12571 Traumatic arthropathy, right ankle and foot: Secondary | ICD-10-CM | POA: Insufficient documentation

## 2015-12-08 DIAGNOSIS — R001 Bradycardia, unspecified: Secondary | ICD-10-CM | POA: Insufficient documentation

## 2015-12-08 DIAGNOSIS — Z01818 Encounter for other preprocedural examination: Secondary | ICD-10-CM | POA: Insufficient documentation

## 2015-12-08 DIAGNOSIS — Z01812 Encounter for preprocedural laboratory examination: Secondary | ICD-10-CM | POA: Diagnosis not present

## 2015-12-08 LAB — CBC
HEMATOCRIT: 44.1 % (ref 36.0–46.0)
HEMOGLOBIN: 14.4 g/dL (ref 12.0–15.0)
MCH: 28.7 pg (ref 26.0–34.0)
MCHC: 32.7 g/dL (ref 30.0–36.0)
MCV: 87.8 fL (ref 78.0–100.0)
Platelets: 212 10*3/uL (ref 150–400)
RBC: 5.02 MIL/uL (ref 3.87–5.11)
RDW: 13.2 % (ref 11.5–15.5)
WBC: 5.8 10*3/uL (ref 4.0–10.5)

## 2015-12-08 LAB — BASIC METABOLIC PANEL
ANION GAP: 9 (ref 5–15)
BUN: 7 mg/dL (ref 6–20)
CHLORIDE: 107 mmol/L (ref 101–111)
CO2: 22 mmol/L (ref 22–32)
CREATININE: 0.69 mg/dL (ref 0.44–1.00)
Calcium: 9.2 mg/dL (ref 8.9–10.3)
GFR calc non Af Amer: >60 mL/min (ref >60)
Glucose, Bld: 78 mg/dL (ref 65–99)
POTASSIUM: 3.5 mmol/L (ref 3.5–5.1)
Sodium: 138 mmol/L (ref 135–145)

## 2015-12-08 LAB — GLUCOSE, CAPILLARY: Glucose-Capillary: 59 mg/dL — ABNORMAL LOW (ref 65–99)

## 2015-12-08 LAB — HCG, SERUM, QUALITATIVE: Preg, Serum: NEGATIVE

## 2015-12-08 NOTE — Progress Notes (Signed)
PCP - Chevis Pretty FNP Cardiologist - dr Casimer Lanius several years ago  Chest x-ray - not needed EKG - 12/08/15 Stress Test - denies ECHO - 02/01/12 Cardiac Cath - denies    Patient denies shortness of breath, fever, cough and chest pain at PAT appointment

## 2015-12-15 ENCOUNTER — Ambulatory Visit (INDEPENDENT_AMBULATORY_CARE_PROVIDER_SITE_OTHER): Payer: BLUE CROSS/BLUE SHIELD | Admitting: Nurse Practitioner

## 2015-12-15 ENCOUNTER — Encounter: Payer: Self-pay | Admitting: Nurse Practitioner

## 2015-12-15 ENCOUNTER — Telehealth: Payer: Self-pay | Admitting: Nurse Practitioner

## 2015-12-15 VITALS — BP 110/68 | HR 87 | Temp 97.4°F | Ht 65.0 in | Wt 219.0 lb

## 2015-12-15 DIAGNOSIS — M25571 Pain in right ankle and joints of right foot: Secondary | ICD-10-CM

## 2015-12-15 DIAGNOSIS — M545 Low back pain, unspecified: Secondary | ICD-10-CM

## 2015-12-15 DIAGNOSIS — M25551 Pain in right hip: Secondary | ICD-10-CM | POA: Diagnosis not present

## 2015-12-15 MED ORDER — CELECOXIB 400 MG PO CAPS: 400.0000 mg | ORAL_CAPSULE | Freq: Every day | ORAL | 5 refills | Status: DC

## 2015-12-15 NOTE — Patient Instructions (Signed)
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
## 2015-12-16 NOTE — Telephone Encounter (Signed)
RE-Faxed notes again today

## 2015-12-26 ENCOUNTER — Other Ambulatory Visit (HOSPITAL_COMMUNITY): Payer: Self-pay | Admitting: Family

## 2015-12-27 ENCOUNTER — Encounter (HOSPITAL_COMMUNITY): Payer: Self-pay | Admitting: *Deleted

## 2015-12-27 NOTE — Progress Notes (Signed)
Pt denies SOB, chest pain, and being under the care of a cardiologist. Pt denies having a cardiac cath  but stated that a stress test was performed in 2013 at [facility]; records requested.  Pt made aware to stop taking Aspirin, vitamins, fish oil Celebrex and herbal medications. Do not take any NSAIDs ie: Ibuprofen, Advil, Naproxen, BC and Goody Powder or any medication containing Aspirin. Pt verbalized understanding of all pre-op instructiions.

## 2015-12-28 ENCOUNTER — Encounter (HOSPITAL_COMMUNITY): Payer: Self-pay | Admitting: Anesthesiology

## 2015-12-28 ENCOUNTER — Ambulatory Visit (HOSPITAL_COMMUNITY)
Admission: RE | Admit: 2015-12-28 | Discharge: 2015-12-29 | Disposition: A | Discharge status: Home / Self Care | Payer: BLUE CROSS/BLUE SHIELD | Source: Ambulatory Visit | Attending: Orthopedic Surgery | Admitting: Orthopedic Surgery

## 2015-12-28 ENCOUNTER — Ambulatory Visit (HOSPITAL_COMMUNITY): Payer: BLUE CROSS/BLUE SHIELD | Admitting: Anesthesiology

## 2015-12-28 ENCOUNTER — Encounter (HOSPITAL_COMMUNITY)
Admission: RE | Disposition: A | Discharge status: Home / Self Care | Payer: Self-pay | Source: Ambulatory Visit | Attending: Orthopedic Surgery

## 2015-12-28 DIAGNOSIS — G2581 Restless legs syndrome: Secondary | ICD-10-CM | POA: Insufficient documentation

## 2015-12-28 DIAGNOSIS — K219 Gastro-esophageal reflux disease without esophagitis: Secondary | ICD-10-CM | POA: Diagnosis not present

## 2015-12-28 DIAGNOSIS — M722 Plantar fascial fibromatosis: Secondary | ICD-10-CM | POA: Diagnosis not present

## 2015-12-28 DIAGNOSIS — K589 Irritable bowel syndrome without diarrhea: Secondary | ICD-10-CM | POA: Insufficient documentation

## 2015-12-28 DIAGNOSIS — Z9851 Tubal ligation status: Secondary | ICD-10-CM | POA: Insufficient documentation

## 2015-12-28 DIAGNOSIS — R0602 Shortness of breath: Secondary | ICD-10-CM | POA: Insufficient documentation

## 2015-12-28 DIAGNOSIS — M12571 Traumatic arthropathy, right ankle and foot: Secondary | ICD-10-CM | POA: Insufficient documentation

## 2015-12-28 DIAGNOSIS — Z86718 Personal history of other venous thrombosis and embolism: Secondary | ICD-10-CM | POA: Insufficient documentation

## 2015-12-28 DIAGNOSIS — M19071 Primary osteoarthritis, right ankle and foot: Secondary | ICD-10-CM | POA: Diagnosis present

## 2015-12-28 DIAGNOSIS — F329 Major depressive disorder, single episode, unspecified: Secondary | ICD-10-CM | POA: Insufficient documentation

## 2015-12-28 DIAGNOSIS — M24574 Contracture, right foot: Secondary | ICD-10-CM | POA: Insufficient documentation

## 2015-12-28 HISTORY — PX: ANKLE FUSION: SHX5718

## 2015-12-28 HISTORY — DX: Short Achilles tendon (acquired), right ankle: M67.01

## 2015-12-28 HISTORY — DX: Other sprain of unspecified foot, initial encounter: S93.699A

## 2015-12-28 LAB — CBC
HCT: 40.7 % (ref 36.0–46.0)
Hemoglobin: 13 g/dL (ref 12.0–15.0)
MCH: 28.6 pg (ref 26.0–34.0)
MCHC: 31.9 g/dL (ref 30.0–36.0)
MCV: 89.5 fL (ref 78.0–100.0)
PLATELETS: 176 10*3/uL (ref 150–400)
RBC: 4.55 MIL/uL (ref 3.87–5.11)
RDW: 13.8 % (ref 11.5–15.5)
WBC: 5.5 10*3/uL (ref 4.0–10.5)

## 2015-12-28 LAB — BASIC METABOLIC PANEL
ANION GAP: 5 (ref 5–15)
BUN: <5 mg/dL — ABNORMAL LOW (ref 6–20)
CALCIUM: 8.6 mg/dL — AB (ref 8.9–10.3)
CO2: 26 mmol/L (ref 22–32)
CREATININE: 0.74 mg/dL (ref 0.44–1.00)
Chloride: 107 mmol/L (ref 101–111)
GFR calc non Af Amer: >60 mL/min (ref >60)
Glucose, Bld: 87 mg/dL (ref 65–99)
Potassium: 4.1 mmol/L (ref 3.5–5.1)
SODIUM: 138 mmol/L (ref 135–145)

## 2015-12-28 LAB — HCG, SERUM, QUALITATIVE: Preg, Serum: NEGATIVE

## 2015-12-28 SURGERY — ANKLE FUSION
Anesthesia: General | Laterality: Right

## 2015-12-28 MED ORDER — CHLORHEXIDINE GLUCONATE 4 % EX LIQD: 60.0000 mL | Freq: Once | CUTANEOUS | Status: DC

## 2015-12-28 MED ORDER — METHOCARBAMOL 500 MG PO TABS: 500.0000 mg | ORAL_TABLET | Freq: Four times a day (QID) | ORAL | Status: DC | PRN

## 2015-12-28 MED ORDER — ASPIRIN EC 325 MG PO TBEC
325.0000 mg | DELAYED_RELEASE_TABLET | Freq: Every day | ORAL | Status: DC
  Administered 2015-12-28: 325 mg via ORAL
  Filled 2015-12-28: qty 1

## 2015-12-28 MED ORDER — ONDANSETRON HCL 4 MG/2ML IJ SOLN
INTRAMUSCULAR | Status: DC | PRN
  Administered 2015-12-28: 4 mg via INTRAVENOUS

## 2015-12-28 MED ORDER — CEFAZOLIN SODIUM-DEXTROSE 2-4 GM/100ML-% IV SOLN: 2.0000 g | INTRAVENOUS | Status: DC

## 2015-12-28 MED ORDER — METOCLOPRAMIDE HCL 5 MG PO TABS: 5.0000 mg | ORAL_TABLET | Freq: Three times a day (TID) | ORAL | Status: DC | PRN

## 2015-12-28 MED ORDER — ACETAMINOPHEN 650 MG RE SUPP: 650.0000 mg | Freq: Four times a day (QID) | RECTAL | Status: DC | PRN

## 2015-12-28 MED ORDER — METHOCARBAMOL 1000 MG/10ML IJ SOLN
500.0000 mg | Freq: Four times a day (QID) | INTRAVENOUS | Status: DC | PRN
  Filled 2015-12-28: qty 5

## 2015-12-28 MED ORDER — HYDROMORPHONE HCL 1 MG/ML IJ SOLN
1.0000 mg | INTRAMUSCULAR | Status: DC | PRN
  Administered 2015-12-28: 1 mg via INTRAVENOUS
  Filled 2015-12-28: qty 1

## 2015-12-28 MED ORDER — FENTANYL CITRATE (PF) 100 MCG/2ML IJ SOLN
INTRAMUSCULAR | Status: AC
  Filled 2015-12-28: qty 2

## 2015-12-28 MED ORDER — ONDANSETRON HCL 4 MG PO TABS: 4.0000 mg | ORAL_TABLET | Freq: Four times a day (QID) | ORAL | Status: DC | PRN

## 2015-12-28 MED ORDER — ACETAMINOPHEN 325 MG PO TABS: 650.0000 mg | ORAL_TABLET | Freq: Four times a day (QID) | ORAL | Status: DC | PRN

## 2015-12-28 MED ORDER — HYDROMORPHONE HCL 1 MG/ML IJ SOLN: 0.5000 mg | INTRAMUSCULAR | Status: DC | PRN

## 2015-12-28 MED ORDER — PROPOFOL 10 MG/ML IV BOLUS
INTRAVENOUS | Status: AC
  Filled 2015-12-28: qty 20

## 2015-12-28 MED ORDER — PROPOFOL 500 MG/50ML IV EMUL
INTRAVENOUS | Status: DC | PRN
  Administered 2015-12-28: 100 ug/kg/min via INTRAVENOUS

## 2015-12-28 MED ORDER — LACTATED RINGERS IV SOLN
INTRAVENOUS | Status: DC | PRN
  Administered 2015-12-28: 08:00:00 via INTRAVENOUS

## 2015-12-28 MED ORDER — PRAMIPEXOLE DIHYDROCHLORIDE 1.5 MG PO TABS
1.5000 mg | ORAL_TABLET | Freq: Every day | ORAL | Status: DC
  Administered 2015-12-28: 1.5 mg via ORAL
  Filled 2015-12-28 (×2): qty 1

## 2015-12-28 MED ORDER — LIDOCAINE HCL (CARDIAC) 20 MG/ML IV SOLN
INTRAVENOUS | Status: DC | PRN
  Administered 2015-12-28: 20 mg via INTRAVENOUS

## 2015-12-28 MED ORDER — OXYCODONE HCL 5 MG PO TABS
5.0000 mg | ORAL_TABLET | ORAL | Status: DC | PRN
  Administered 2015-12-28: 5 mg via ORAL
  Filled 2015-12-28: qty 2

## 2015-12-28 MED ORDER — METOCLOPRAMIDE HCL 5 MG/ML IJ SOLN: 5.0000 mg | Freq: Three times a day (TID) | INTRAMUSCULAR | Status: DC | PRN

## 2015-12-28 MED ORDER — ONDANSETRON HCL 4 MG/2ML IJ SOLN: 4.0000 mg | Freq: Once | INTRAMUSCULAR | Status: DC | PRN

## 2015-12-28 MED ORDER — SODIUM CHLORIDE 0.9 % IV SOLN
INTRAVENOUS | Status: DC
Start: 2015-12-28 — End: 2015-12-29
  Administered 2015-12-28: 11:00:00 via INTRAVENOUS

## 2015-12-28 MED ORDER — DEXAMETHASONE SODIUM PHOSPHATE 10 MG/ML IJ SOLN
INTRAMUSCULAR | Status: DC | PRN
  Administered 2015-12-28: 10 mg via INTRAVENOUS

## 2015-12-28 MED ORDER — PROPOFOL 10 MG/ML IV BOLUS
INTRAVENOUS | Status: DC | PRN
  Administered 2015-12-28: 20 mg via INTRAVENOUS

## 2015-12-28 MED ORDER — KETOROLAC TROMETHAMINE 15 MG/ML IJ SOLN
15.0000 mg | Freq: Four times a day (QID) | INTRAMUSCULAR | Status: AC
  Administered 2015-12-28 – 2015-12-29 (×4): 15 mg via INTRAVENOUS
  Filled 2015-12-28 (×4): qty 1

## 2015-12-28 MED ORDER — CEFAZOLIN SODIUM-DEXTROSE 2-4 GM/100ML-% IV SOLN
2.0000 g | INTRAVENOUS | Status: AC
  Administered 2015-12-28: 2 g via INTRAVENOUS
  Filled 2015-12-28: qty 100

## 2015-12-28 MED ORDER — ONDANSETRON HCL 4 MG/2ML IJ SOLN
4.0000 mg | Freq: Four times a day (QID) | INTRAMUSCULAR | Status: DC | PRN
  Administered 2015-12-28: 4 mg via INTRAVENOUS
  Filled 2015-12-28: qty 2

## 2015-12-28 MED ORDER — 0.9 % SODIUM CHLORIDE (POUR BTL) OPTIME
TOPICAL | Status: DC | PRN
  Administered 2015-12-28: 1000 mL

## 2015-12-28 MED ORDER — CEFAZOLIN IN D5W 1 GM/50ML IV SOLN
1.0000 g | Freq: Four times a day (QID) | INTRAVENOUS | Status: AC
  Administered 2015-12-28 – 2015-12-29 (×3): 1 g via INTRAVENOUS
  Filled 2015-12-28 (×3): qty 50

## 2015-12-28 SURGICAL SUPPLY — 42 items
BANDAGE ESMARK 6X9 LF (GAUZE/BANDAGES/DRESSINGS) ×1 IMPLANT
BLADE SAW SGTL HD 18.5X60.5X1. (BLADE) IMPLANT
BLADE SURG 10 STRL SS (BLADE) IMPLANT
BNDG CMPR 9X6 STRL LF SNTH (GAUZE/BANDAGES/DRESSINGS) ×1
BNDG COHESIVE 4X5 TAN STRL (GAUZE/BANDAGES/DRESSINGS) ×2 IMPLANT
BNDG COHESIVE 6X5 TAN STRL LF (GAUZE/BANDAGES/DRESSINGS) ×2 IMPLANT
BNDG ESMARK 6X9 LF (GAUZE/BANDAGES/DRESSINGS) ×2
BNDG GAUZE ELAST 4 BULKY (GAUZE/BANDAGES/DRESSINGS) ×2 IMPLANT
COVER MAYO STAND STRL (DRAPES) ×2 IMPLANT
COVER SURGICAL LIGHT HANDLE (MISCELLANEOUS) ×2 IMPLANT
DRAPE OEC MINIVIEW 54X84 (DRAPES) ×2 IMPLANT
DRAPE U-SHAPE 47X51 STRL (DRAPES) ×2 IMPLANT
DRSG ADAPTIC 3X8 NADH LF (GAUZE/BANDAGES/DRESSINGS) ×2 IMPLANT
DURAPREP 26ML APPLICATOR (WOUND CARE) ×2 IMPLANT
ELECT REM PT RETURN 9FT ADLT (ELECTROSURGICAL) ×2
ELECTRODE REM PT RTRN 9FT ADLT (ELECTROSURGICAL) ×1 IMPLANT
GAUZE SPONGE 4X4 12PLY STRL (GAUZE/BANDAGES/DRESSINGS) ×2 IMPLANT
GLOVE BIOGEL PI IND STRL 9 (GLOVE) ×1 IMPLANT
GLOVE BIOGEL PI INDICATOR 9 (GLOVE) ×1
GLOVE SURG ORTHO 9.0 STRL STRW (GLOVE) ×2 IMPLANT
GOWN STRL REUS W/ TWL LRG LVL3 (GOWN DISPOSABLE) ×1 IMPLANT
GOWN STRL REUS W/ TWL XL LVL3 (GOWN DISPOSABLE) ×1 IMPLANT
GOWN STRL REUS W/TWL LRG LVL3 (GOWN DISPOSABLE) ×2
GOWN STRL REUS W/TWL XL LVL3 (GOWN DISPOSABLE) ×2
GUIDEWIRE THREADED 2.8 (WIRE) ×4 IMPLANT
KIT BASIN OR (CUSTOM PROCEDURE TRAY) ×2 IMPLANT
KIT ROOM TURNOVER OR (KITS) ×2 IMPLANT
NS IRRIG 1000ML POUR BTL (IV SOLUTION) ×2 IMPLANT
PACK ORTHO EXTREMITY (CUSTOM PROCEDURE TRAY) ×2 IMPLANT
PAD ARMBOARD 7.5X6 YLW CONV (MISCELLANEOUS) ×4 IMPLANT
PUTTY BONE DBX 5CC MIX (Putty) ×2 IMPLANT
SCREW COMP HEADLESS 6.5X75 (Screw) ×2 IMPLANT
SCREW HL THREAD 6.5X65 (Screw) ×2 IMPLANT
SPONGE GAUZE 4X4 12PLY STER LF (GAUZE/BANDAGES/DRESSINGS) ×2 IMPLANT
SPONGE LAP 18X18 X RAY DECT (DISPOSABLE) ×2 IMPLANT
SUCTION FRAZIER HANDLE 10FR (MISCELLANEOUS) ×1
SUCTION TUBE FRAZIER 10FR DISP (MISCELLANEOUS) ×1 IMPLANT
SUT ETHILON 2 0 PSLX (SUTURE) ×6 IMPLANT
TOWEL OR 17X24 6PK STRL BLUE (TOWEL DISPOSABLE) ×2 IMPLANT
TOWEL OR 17X26 10 PK STRL BLUE (TOWEL DISPOSABLE) ×2 IMPLANT
TUBE CONNECTING 12X1/4 (SUCTIONS) ×2 IMPLANT
WATER STERILE IRR 1000ML POUR (IV SOLUTION) IMPLANT

## 2015-12-28 NOTE — Transfer of Care (Signed)
Immediate Anesthesia Transfer of Care Note  Patient: Margaret Cruz  Procedure(s) Performed: Procedure(s): Right Subtalar Fusion with a gastrocnemius recession plantar fasciitis release (Right)  Patient Location: PACU  Anesthesia Type:MAC and Spinal  Level of Consciousness: awake, alert , oriented and patient cooperative  Airway & Oxygen Therapy: Patient Spontanous Breathing and Patient connected to nasal cannula oxygen  Post-op Assessment: Report given to RN and Post -op Vital signs reviewed and stable  Post vital signs: Reviewed and stable  Last Vitals:  Vitals:   12/28/15 0714 12/28/15 0926  BP: 113/61 (!) (P) 141/110  Pulse: (!) 54 (!) (P) 56  Resp: 20 (P) 14  Temp: 36.7 C (P) 36.4 C    Last Pain:  Vitals:   12/28/15 0755  TempSrc:   PainSc: 5          Complications: No apparent anesthesia complications

## 2015-12-28 NOTE — Anesthesia Procedure Notes (Signed)
Spinal  Patient location during procedure: OR Start time: 12/28/2015 8:25 AM End time: 12/28/2015 8:27 AM Staffing Anesthesiologist: Kate Sable Performed: anesthesiologist  Preanesthetic Checklist Completed: patient identified, site marked, surgical consent, pre-op evaluation, timeout performed, IV checked, risks and benefits discussed and monitors and equipment checked Spinal Block Patient position: sitting Prep: DuraPrep Patient monitoring: heart rate, cardiac monitor, continuous pulse ox and blood pressure Approach: midline Location: L2-3 Injection technique: single-shot Needle Needle type: [place]acre  Needle gauge: 25 G Needle length: 9 cm Needle insertion depth: 5 cm Assessment Sensory level: L1 Additional Notes Pt accepts procedure w/ risks. 12mg  0.5% Marcaine w/ epi w/o difficulty. GES

## 2015-12-28 NOTE — Anesthesia Preprocedure Evaluation (Signed)
Anesthesia Evaluation  Patient identified by MRN, date of birth, ID band Patient awake    Reviewed: Allergy & Precautions, NPO status , Patient's Chart, lab work & pertinent test results  History of Anesthesia Complications (+) PONV  Airway Mallampati: I       Dental   Pulmonary shortness of breath,    Pulmonary exam normal        Cardiovascular Normal cardiovascular exam     Neuro/Psych  Headaches, PSYCHIATRIC DISORDERS Depression  Neuromuscular disease    GI/Hepatic GERD  ,  Endo/Other    Renal/GU      Musculoskeletal  (+) Arthritis ,   Abdominal   Peds  Hematology   Anesthesia Other Findings   Reproductive/Obstetrics                             Anesthesia Physical Anesthesia Plan  ASA: I  Anesthesia Plan: General   Post-op Pain Management:    Induction: Intravenous  Airway Management Planned: LMA and Oral ETT  Additional Equipment:   Intra-op Plan:   Post-operative Plan: Extubation in OR  Informed Consent: I have reviewed the patients History and Physical, chart, labs and discussed the procedure including the risks, benefits and alternatives for the proposed anesthesia with the patient or authorized representative who has indicated his/her understanding and acceptance.     Plan Discussed with: CRNA, Anesthesiologist and Surgeon  Anesthesia Plan Comments:         Anesthesia Quick Evaluation

## 2015-12-28 NOTE — Progress Notes (Signed)
Orthopedic Tech Progress Note Patient Details:  Margaret Cruz [birth year] [id number]  Ortho Devices Type of Ortho Device: CAM walker Ortho Device/Splint Location: rle Ortho Device/Splint Interventions: Application   Hershal Eriksson 12/28/2015, 9:54 AM
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
## 2015-12-28 NOTE — Op Note (Signed)
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 female with a diagnosis of Traumatic Subtalar Arthritis Right Foot who failed conservative measures and elected for surgical management.    The risks benefits and alternatives were discussed with the patient preoperatively including but not limited to the risks of infection, bleeding, nerve injury, cardiopulmonary complications, the need for revision surgery, among others, and the patient was willing to proceed.  OPERATIVE IMPLANTS: Demineralized bone matrix plus bone graft  DBX 5 mL  OPERATIVE FINDINGS: Soft osteoporotic bone  OPERATIVE PROCEDURE: Patient brought the operating room and underwent spinal anesthetic. After adequate levels anesthesia obtained patient's right lower extremity was prepped using DuraPrep draped into a sterile field a timeout was called. Tension was first focused on the gastrocnemius fascia. A longitudinal incision was made medially 15 cm proximal to the medial malleolus. Blunt dissection was carried down to the gastrocnemius fascia and the gastrocnemius fascia was released under direct visualization. Patient had dorsiflexion about 10 short of neutral preoperatively and about 30 post neutral after release of the gastrocnemius fascia. The wound was irrigated normal saline hemostasis was obtained and the incision was closed using 2-0 nylon. Attention was then focused over the subtalar joint. Oblique incision was made over the sinus Tarsi this was bluntly carried down to the fascia which was incised retractors were placed to protect the peroneal tendons using curette and osteotome the subtalar  joint was debrided of articular cartilage back to bleeding viable subchondral bone. 2 K wires were then inserted from the calcaneus into the talus to stabilize the subtalar joint patient had good range of motion of the ankle. The subtalar joint was packed with the DBX bone matrix and 2 compression screws were then placed to stabilize the subtalar joint. Incision was closed using 2-0 nylon a sterile compressive dressing was applied patient was taken to the PACU in stable condition plan for overnight observation.
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
## 2015-12-28 NOTE — Anesthesia Postprocedure Evaluation (Signed)
Anesthesia Post Note  Patient: Margaret Cruz  Procedure(s) Performed: Procedure(s) (LRB): Right Subtalar Fusion with a gastrocnemius recession plantar fasciitis release (Right)  Patient location during evaluation: PACU Anesthesia Type: Spinal Level of consciousness: awake, awake and alert, oriented and patient cooperative Pain management: pain level controlled Vital Signs Assessment: post-procedure vital signs reviewed and stable Respiratory status: spontaneous breathing and respiratory function stable Cardiovascular status: stable Anesthetic complications: no    Last Vitals:  Vitals:   12/28/15 0930 12/28/15 0945  BP: (!) 93/52 (!) 104/51  Pulse: (!) 52 (!) 54  Resp: 17 14  Temp:      Last Pain:  Vitals:   12/28/15 0755  TempSrc:   PainSc: 5                  Margaret Cruz

## 2015-12-29 ENCOUNTER — Encounter (HOSPITAL_COMMUNITY): Payer: Self-pay | Admitting: Orthopedic Surgery

## 2015-12-29 DIAGNOSIS — M12571 Traumatic arthropathy, right ankle and foot: Secondary | ICD-10-CM | POA: Diagnosis not present

## 2015-12-29 MED ORDER — OXYCODONE-ACETAMINOPHEN 5-325 MG PO TABS: 1.0000 | ORAL_TABLET | ORAL | 0 refills | Status: DC | PRN

## 2015-12-29 NOTE — Care Management Note (Addendum)
12/29/2015 1710 Faxed orders to [facility] for wheelchair for scheduled delivery to home. NCM contacted pt to follow up. Pt will contact NCM tomorrow afternoon on status of wheelchair. Spoke to DME rep and will follow on delivery times for w/c in am.   Jonnie Finner RN CCM Case Mgmt phone [phone]   12/29/2015 1600 NCM contacted pt to make aware that [facility] will deliver wheelchair to the home. Provided her with number to contact office if she has not received. Jonnie Finner RN CCM Case Mgmt phone [phone]  12/29/2015 1136 NCM contacted [facility] for delivery of wheelchair to home. NCM contacted pt's home and left message for return call. HHPT needed. Contacted [facility] with new referral.  Jonnie Finner RN CCM Case Mgmt phone [phone]  Case Management Note  Patient Details  Name: Margaret Cruz MRN: [id number] Date of Birth: 05/19/1970  Subjective/Objective:    S/p right subtalar fusion, NWB R LE                Action/Plan: Discharge Planning: AVS reviewed:   NCM spoke to pt and offered choice for HH/list provided. Pt requested [facility] for HH. Contacted [facility] for [facility]. Pt states she needs wheelchair for home. States she has crutches, RW, bedside commode, and transport chair.  Contacted attending, Dr Sharol Given office for clarification on [facility] and order for wheelchair.   PCP- Chevis Pretty MD  Expected Discharge Date:  12/29/2015               Expected Discharge Plan:  [place]  In-House Referral:  NA  Discharge planning Services  CM Consult  Post Acute Care Choice:  Home Health Choice offered to:  Patient  DME Arranged:  Wheelchair  DME Agency:  [place]  HH Arranged:  PT [facility] Agency:  [place]  Status of Service:  Completed, signed off  If discussed at [place] of Stay Meetings, dates discussed:    Additional Comments:  Erenest Rasher, RN 12/29/2015, 11:07 AM
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
## 2015-12-29 NOTE — Discharge Summary (Signed)
Physician Discharge Summary  Patient ID: Margaret Cruz MRN: [id number] DOB/AGE: 10-06-1970 45 y.o.  Admit date: 12/28/2015 Discharge date: 12/29/2015  Admission Diagnoses:Right subtalar arthrosis with heel cord contracture  Discharge Diagnoses: Same Active Problems:   Osteoarthritis of right foot   Discharged Condition: stable  Hospital Course: Patient's postoperative course was essentially unremarkable. Patient progressed well with therapy and was discharged to home in stable condition.  Consults: None  Significant Diagnostic Studies: labs: Routine labs  Treatments: surgery: See operative note  Discharge Exam: Blood pressure (!) 96/50, pulse 60, temperature 97.9 F (36.6 C), temperature source Oral, resp. rate 16, height 5\' 5"  (1.651 m), weight 99.3 kg (219 lb), last menstrual period 12/16/2015, SpO2 96 %. Incision/Wound: Dressing clean and dry  Disposition: 01-Home or Self Care  Discharge Instructions    Call MD / Call 911    Complete by:  As directed    If you experience chest pain or shortness of breath, CALL 911 and be transported to the hospital emergency room.  If you develope a fever above 101 F, pus (white drainage) or increased drainage or redness at the wound, or calf pain, call your surgeon's office.   Constipation Prevention    Complete by:  As directed    Drink plenty of fluids.  Prune juice may be helpful.  You may use a stool softener, such as Colace (over the counter) 100 mg twice a day.  Use MiraLax (over the counter) for constipation as needed.   Diet - low sodium heart healthy    Complete by:  As directed    Increase activity slowly as tolerated    Complete by:  As directed    Non weight bearing    Complete by:  As directed    Laterality:  right   Extremity:  Lower       Medication List    TAKE these medications   celecoxib 400 MG capsule Commonly known as:  CELEBREX Take 1 capsule (400 mg total) by mouth daily.   ibuprofen 200 MG  tablet Commonly known as:  ADVIL,MOTRIN Take 400-800 mg by mouth every 6 (six) hours as needed for mild pain.   oxyCODONE-acetaminophen 5-325 MG tablet Commonly known as:  ROXICET Take 1 tablet by mouth every 4 (four) hours as needed for severe pain.   pramipexole 1.5 MG tablet Commonly known as:  MIRAPEX Take 1 tablet (1.5 mg total) by mouth 3 (three) times daily. What changed:  when to take this      Follow-up Willis, MD In 1 week.   Specialty:  Orthopedic Surgery Contact information: [place] [address] [phone]           Signed: Newt Minion 12/29/2015, 7:46 AM

## 2015-12-30 ENCOUNTER — Other Ambulatory Visit (HOSPITAL_COMMUNITY): Payer: Self-pay | Admitting: Family

## 2016-01-26 ENCOUNTER — Ambulatory Visit (INDEPENDENT_AMBULATORY_CARE_PROVIDER_SITE_OTHER): Payer: Self-pay | Admitting: Orthopedic Surgery

## 2016-01-26 DIAGNOSIS — M722 Plantar fascial fibromatosis: Secondary | ICD-10-CM

## 2016-01-26 DIAGNOSIS — M19171 Post-traumatic osteoarthritis, right ankle and foot: Secondary | ICD-10-CM

## 2016-01-26 DIAGNOSIS — M6701 Short Achilles tendon (acquired), right ankle: Secondary | ICD-10-CM

## 2016-02-08 ENCOUNTER — Ambulatory Visit (INDEPENDENT_AMBULATORY_CARE_PROVIDER_SITE_OTHER): Payer: BLUE CROSS/BLUE SHIELD | Admitting: Family

## 2016-02-08 ENCOUNTER — Encounter (INDEPENDENT_AMBULATORY_CARE_PROVIDER_SITE_OTHER): Payer: Self-pay | Admitting: Family

## 2016-02-08 VITALS — Ht 65.0 in | Wt 210.0 lb

## 2016-02-08 DIAGNOSIS — Z981 Arthrodesis status: Secondary | ICD-10-CM

## 2016-02-08 DIAGNOSIS — I872 Venous insufficiency (chronic) (peripheral): Secondary | ICD-10-CM
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
## 2016-02-23 ENCOUNTER — Encounter (INDEPENDENT_AMBULATORY_CARE_PROVIDER_SITE_OTHER): Payer: Self-pay | Admitting: Orthopedic Surgery

## 2016-02-23 ENCOUNTER — Ambulatory Visit (INDEPENDENT_AMBULATORY_CARE_PROVIDER_SITE_OTHER): Payer: BLUE CROSS/BLUE SHIELD | Admitting: Orthopedic Surgery

## 2016-02-23 VITALS — Ht 65.0 in | Wt 210.0 lb

## 2016-02-23 DIAGNOSIS — I87321 Chronic venous hypertension (idiopathic) with inflammation of right lower extremity: Secondary | ICD-10-CM
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
## 2016-02-27 ENCOUNTER — Ambulatory Visit: Payer: Self-pay | Admitting: Nurse Practitioner

## 2016-02-28 ENCOUNTER — Encounter: Payer: Self-pay | Admitting: Nurse Practitioner

## 2016-02-28 ENCOUNTER — Encounter (INDEPENDENT_AMBULATORY_CARE_PROVIDER_SITE_OTHER): Payer: Self-pay | Admitting: Orthopedic Surgery

## 2016-03-05 ENCOUNTER — Encounter: Payer: Self-pay | Admitting: Nurse Practitioner

## 2016-03-22 ENCOUNTER — Ambulatory Visit (INDEPENDENT_AMBULATORY_CARE_PROVIDER_SITE_OTHER): Payer: BLUE CROSS/BLUE SHIELD | Admitting: Orthopedic Surgery

## 2016-03-22 ENCOUNTER — Encounter (INDEPENDENT_AMBULATORY_CARE_PROVIDER_SITE_OTHER): Payer: Self-pay | Admitting: Orthopedic Surgery

## 2016-03-22 DIAGNOSIS — M19171 Post-traumatic osteoarthritis, right ankle and foot: Secondary | ICD-10-CM
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
## 2016-04-19 ENCOUNTER — Ambulatory Visit (INDEPENDENT_AMBULATORY_CARE_PROVIDER_SITE_OTHER): Payer: BLUE CROSS/BLUE SHIELD | Admitting: Orthopedic Surgery

## 2016-04-21 ENCOUNTER — Encounter (INDEPENDENT_AMBULATORY_CARE_PROVIDER_SITE_OTHER): Payer: Self-pay | Admitting: Orthopedic Surgery

## 2016-04-23 ENCOUNTER — Telehealth (INDEPENDENT_AMBULATORY_CARE_PROVIDER_SITE_OTHER): Payer: Self-pay | Admitting: Orthopedic Surgery

## 2016-04-23 NOTE — Telephone Encounter (Signed)
Patient called asked if she can get a note for her to return to work with any restrictions listed on the note. Patient said she had surgery in September done by Dr Sharol Given. The number to contact her is [phone]

## 2016-05-13 ENCOUNTER — Other Ambulatory Visit: Payer: Self-pay | Admitting: Nurse Practitioner

## 2016-05-15 ENCOUNTER — Ambulatory Visit (INDEPENDENT_AMBULATORY_CARE_PROVIDER_SITE_OTHER): Payer: Self-pay | Admitting: Orthopedic Surgery

## 2016-05-15 ENCOUNTER — Ambulatory Visit (INDEPENDENT_AMBULATORY_CARE_PROVIDER_SITE_OTHER): Payer: Self-pay

## 2016-05-15 VITALS — Ht 65.0 in | Wt 210.0 lb

## 2016-05-15 DIAGNOSIS — M79671 Pain in right foot: Secondary | ICD-10-CM
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
## 2016-08-28 ENCOUNTER — Telehealth: Payer: Self-pay | Admitting: Nurse Practitioner

## 2016-08-28 MED ORDER — CLINDAMYCIN HCL 300 MG PO CAPS: 300.0000 mg | ORAL_CAPSULE | Freq: Four times a day (QID) | ORAL | 0 refills | Status: DC

## 2016-08-28 NOTE — Telephone Encounter (Signed)
Patient called in stating that she has a tooth tha has broken off and gums around it are red a swollen. SHe has started a new job and cannot leave work during orientation period to go to [employer], community. Would like antibiotic called in, if possible. Called in antibiotic with stipulation that she needs to be seen as soon as possible by either our office or by dentist office

## 2016-12-13 ENCOUNTER — Ambulatory Visit (INDEPENDENT_AMBULATORY_CARE_PROVIDER_SITE_OTHER): Payer: Self-pay | Admitting: Family

## 2016-12-28 ENCOUNTER — Ambulatory Visit (INDEPENDENT_AMBULATORY_CARE_PROVIDER_SITE_OTHER): Payer: Self-pay | Admitting: Family

## 2017-01-07 ENCOUNTER — Encounter (INDEPENDENT_AMBULATORY_CARE_PROVIDER_SITE_OTHER): Payer: Self-pay | Admitting: Orthopedic Surgery

## 2017-01-07 ENCOUNTER — Ambulatory Visit (INDEPENDENT_AMBULATORY_CARE_PROVIDER_SITE_OTHER): Payer: 59 | Admitting: Orthopedic Surgery

## 2017-01-07 DIAGNOSIS — M722 Plantar fascial fibromatosis: Secondary | ICD-10-CM | POA: Insufficient documentation

## 2017-01-07 MED ORDER — DICLOFENAC SODIUM 1 % TD GEL: 2.0000 g | Freq: Four times a day (QID) | TRANSDERMAL | 3 refills | Status: AC | PRN
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
## 2017-01-24 ENCOUNTER — Ambulatory Visit (INDEPENDENT_AMBULATORY_CARE_PROVIDER_SITE_OTHER): Payer: 59 | Admitting: Orthopedic Surgery

## 2017-03-11 ENCOUNTER — Ambulatory Visit (INDEPENDENT_AMBULATORY_CARE_PROVIDER_SITE_OTHER): Payer: 59 | Admitting: Orthopedic Surgery

## 2017-03-11 ENCOUNTER — Encounter (INDEPENDENT_AMBULATORY_CARE_PROVIDER_SITE_OTHER): Payer: Self-pay

## 2017-03-15 ENCOUNTER — Encounter: Payer: Self-pay | Admitting: Nurse Practitioner

## 2017-03-22 ENCOUNTER — Ambulatory Visit: Payer: 59 | Admitting: Nurse Practitioner
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
## 2017-04-18 ENCOUNTER — Encounter: Payer: 59 | Admitting: Certified Nurse Midwife

## 2017-05-05 ENCOUNTER — Other Ambulatory Visit: Payer: Self-pay | Admitting: Nurse Practitioner

## 2017-05-09 ENCOUNTER — Other Ambulatory Visit: Payer: Self-pay | Admitting: Nurse Practitioner

## 2017-05-09 ENCOUNTER — Encounter: Payer: Self-pay | Admitting: Nurse Practitioner

## 2017-05-09 MED ORDER — PRAMIPEXOLE DIHYDROCHLORIDE 1.5 MG PO TABS: 1.5000 mg | ORAL_TABLET | Freq: Three times a day (TID) | ORAL | 0 refills | Status: DC

## 2017-05-10 ENCOUNTER — Other Ambulatory Visit: Payer: Self-pay | Admitting: Nurse Practitioner

## 2017-05-10 DIAGNOSIS — Z1231 Encounter for screening mammogram for malignant neoplasm of breast: Secondary | ICD-10-CM

## 2017-05-13 ENCOUNTER — Ambulatory Visit (HOSPITAL_COMMUNITY): Payer: BLUE CROSS/BLUE SHIELD

## 2017-05-14 ENCOUNTER — Other Ambulatory Visit: Payer: Self-pay | Admitting: Nurse Practitioner

## 2017-05-17 ENCOUNTER — Telehealth: Payer: Self-pay | Admitting: Nurse Practitioner

## 2017-05-17 ENCOUNTER — Encounter: Payer: 59 | Admitting: Nurse Practitioner

## 2017-05-20 ENCOUNTER — Encounter (HOSPITAL_COMMUNITY): Payer: Self-pay

## 2017-05-20 ENCOUNTER — Ambulatory Visit (HOSPITAL_COMMUNITY)
Admission: RE | Admit: 2017-05-20 | Discharge: 2017-05-20 | Disposition: A | Discharge status: Home / Self Care | Payer: 59 | Source: Ambulatory Visit | Attending: Nurse Practitioner | Admitting: Nurse Practitioner

## 2017-05-20 ENCOUNTER — Other Ambulatory Visit: Payer: Self-pay | Admitting: Nurse Practitioner

## 2017-05-20 ENCOUNTER — Encounter: Payer: Self-pay | Admitting: Nurse Practitioner

## 2017-05-20 DIAGNOSIS — R928 Other abnormal and inconclusive findings on diagnostic imaging of breast: Secondary | ICD-10-CM | POA: Diagnosis not present

## 2017-05-20 DIAGNOSIS — Z1231 Encounter for screening mammogram for malignant neoplasm of breast: Secondary | ICD-10-CM | POA: Diagnosis not present

## 2017-05-28 ENCOUNTER — Ambulatory Visit (HOSPITAL_COMMUNITY)
Admission: RE | Admit: 2017-05-28 | Discharge: 2017-05-28 | Disposition: A | Discharge status: Home / Self Care | Payer: 59 | Source: Ambulatory Visit | Attending: Nurse Practitioner | Admitting: Nurse Practitioner

## 2017-05-28 DIAGNOSIS — N6314 Unspecified lump in the right breast, lower inner quadrant: Secondary | ICD-10-CM | POA: Diagnosis not present

## 2017-05-28 DIAGNOSIS — R928 Other abnormal and inconclusive findings on diagnostic imaging of breast: Secondary | ICD-10-CM

## 2017-05-30 ENCOUNTER — Ambulatory Visit: Payer: Self-pay | Admitting: Nurse Practitioner

## 2017-05-30 ENCOUNTER — Encounter: Payer: 59 | Admitting: Certified Nurse Midwife

## 2017-05-31 ENCOUNTER — Ambulatory Visit (INDEPENDENT_AMBULATORY_CARE_PROVIDER_SITE_OTHER): Payer: 59 | Admitting: Nurse Practitioner

## 2017-05-31 ENCOUNTER — Encounter: Payer: Self-pay | Admitting: Nurse Practitioner

## 2017-05-31 VITALS — BP 119/71 | HR 61 | Temp 97.1°F | Ht 64.0 in | Wt 223.0 lb

## 2017-05-31 DIAGNOSIS — Z0000 Encounter for general adult medical examination without abnormal findings: Secondary | ICD-10-CM | POA: Diagnosis not present

## 2017-05-31 DIAGNOSIS — G43109 Migraine with aura, not intractable, without status migrainosus: Secondary | ICD-10-CM

## 2017-05-31 DIAGNOSIS — R600 Localized edema: Secondary | ICD-10-CM

## 2017-05-31 DIAGNOSIS — Z01419 Encounter for gynecological examination (general) (routine) without abnormal findings: Secondary | ICD-10-CM

## 2017-05-31 DIAGNOSIS — M19171 Post-traumatic osteoarthritis, right ankle and foot: Secondary | ICD-10-CM

## 2017-05-31 LAB — URINALYSIS, COMPLETE
Bilirubin, UA: NEGATIVE
GLUCOSE, UA: NEGATIVE
KETONES UA: NEGATIVE
LEUKOCYTES UA: NEGATIVE
Nitrite, UA: NEGATIVE
Protein, UA: NEGATIVE
RBC, UA: NEGATIVE
Urobilinogen, Ur: 0.2 mg/dL (ref 0.2–1.0)
pH, UA: 5.5 (ref 5.0–7.5)

## 2017-05-31 LAB — MICROSCOPIC EXAMINATION
Bacteria, UA: NONE SEEN
Epithelial Cells (non renal): NONE SEEN /hpf (ref 0–10)
RBC, UA: NONE SEEN /hpf (ref 0–?)
Renal Epithel, UA: NONE SEEN /hpf
WBC, UA: NONE SEEN /hpf (ref 0–?)

## 2017-05-31 NOTE — Patient Instructions (Signed)
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
## 2017-06-01 LAB — THYROID PANEL WITH TSH
FREE THYROXINE INDEX: 2.3 (ref 1.2–4.9)
T3 Uptake Ratio: 21 % — ABNORMAL LOW (ref 24–39)
T4 TOTAL: 11 ug/dL (ref 4.5–12.0)
TSH: 2.09 u[IU]/mL (ref 0.450–4.500)

## 2017-06-01 LAB — CBC WITH DIFFERENTIAL/PLATELET
BASOS ABS: 0 10*3/uL (ref 0.0–0.2)
Basos: 0 %
EOS (ABSOLUTE): 0.1 10*3/uL (ref 0.0–0.4)
EOS: 1 %
HEMOGLOBIN: 13.9 g/dL (ref 11.1–15.9)
Hematocrit: 42.4 % (ref 34.0–46.6)
IMMATURE GRANS (ABS): 0 10*3/uL (ref 0.0–0.1)
Immature Granulocytes: 0 %
LYMPHS: 30 %
Lymphocytes Absolute: 1.8 10*3/uL (ref 0.7–3.1)
MCH: 28.8 pg (ref 26.6–33.0)
MCHC: 32.8 g/dL (ref 31.5–35.7)
MCV: 88 fL (ref 79–97)
MONOCYTES: 6 %
Monocytes Absolute: 0.4 10*3/uL (ref 0.1–0.9)
NEUTROS ABS: 3.7 10*3/uL (ref 1.4–7.0)
Neutrophils: 63 %
Platelets: 221 10*3/uL (ref 150–379)
RBC: 4.83 x10E6/uL (ref 3.77–5.28)
RDW: 13.5 % (ref 12.3–15.4)
WBC: 6 10*3/uL (ref 3.4–10.8)

## 2017-06-01 LAB — CMP14+EGFR
A/G RATIO: 1.6 (ref 1.2–2.2)
ALK PHOS: 64 IU/L (ref 39–117)
ALT: 10 IU/L (ref 0–32)
AST: 7 IU/L (ref 0–40)
Albumin: 4 g/dL (ref 3.5–5.5)
BILIRUBIN TOTAL: 0.4 mg/dL (ref 0.0–1.2)
BUN/Creatinine Ratio: 10 (ref 9–23)
BUN: 7 mg/dL (ref 6–24)
CHLORIDE: 104 mmol/L (ref 96–106)
CO2: 21 mmol/L (ref 20–29)
Calcium: 9 mg/dL (ref 8.7–10.2)
Creatinine, Ser: 0.73 mg/dL (ref 0.57–1.00)
GFR calc non Af Amer: 99 mL/min/{1.73_m2} (ref >59)
GFR, EST AFRICAN AMERICAN: 114 mL/min/{1.73_m2} (ref >59)
GLUCOSE: 70 mg/dL (ref 65–99)
Globulin, Total: 2.5 g/dL (ref 1.5–4.5)
POTASSIUM: 4.1 mmol/L (ref 3.5–5.2)
Sodium: 142 mmol/L (ref 134–144)
Total Protein: 6.5 g/dL (ref 6.0–8.5)

## 2017-06-01 LAB — LIPID PANEL
CHOLESTEROL TOTAL: 159 mg/dL (ref 100–199)
Chol/HDL Ratio: 2.8 ratio (ref 0.0–4.4)
HDL: 56 mg/dL (ref >39)
LDL Calculated: 85 mg/dL (ref 0–99)
Triglycerides: 91 mg/dL (ref 0–149)
VLDL Cholesterol Cal: 18 mg/dL (ref 5–40)

## 2017-06-04 LAB — IGP, APTIMA HPV, RFX 16/18,45
HPV APTIMA: NEGATIVE
PAP Smear Comment: 0

## 2017-06-17 ENCOUNTER — Encounter: Payer: 59 | Admitting: Obstetrics and Gynecology

## 2017-08-05 ENCOUNTER — Encounter: Payer: Self-pay | Admitting: Nurse Practitioner

## 2017-08-05 NOTE — Telephone Encounter (Signed)
Needs appt

## 2017-08-06 ENCOUNTER — Encounter: Payer: Self-pay | Admitting: Nurse Practitioner

## 2017-08-06 ENCOUNTER — Ambulatory Visit (INDEPENDENT_AMBULATORY_CARE_PROVIDER_SITE_OTHER): Payer: 59 | Admitting: Nurse Practitioner

## 2017-08-06 VITALS — BP 118/76 | HR 63 | Temp 96.8°F | Ht 64.0 in | Wt 218.0 lb

## 2017-08-06 DIAGNOSIS — F321 Major depressive disorder, single episode, moderate: Secondary | ICD-10-CM

## 2017-08-06 MED ORDER — ESCITALOPRAM OXALATE 10 MG PO TABS: 10.0000 mg | ORAL_TABLET | Freq: Every day | ORAL | 1 refills | Status: DC
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
## 2017-09-06 ENCOUNTER — Other Ambulatory Visit: Payer: Self-pay | Admitting: Surgery

## 2017-09-06 DIAGNOSIS — R109 Unspecified abdominal pain: Secondary | ICD-10-CM

## 2017-09-10 ENCOUNTER — Ambulatory Visit
Admission: RE | Admit: 2017-09-10 | Discharge: 2017-09-10 | Disposition: A | Discharge status: Home / Self Care | Payer: BLUE CROSS/BLUE SHIELD | Source: Ambulatory Visit | Attending: Surgery | Admitting: Surgery

## 2017-09-10 DIAGNOSIS — R109 Unspecified abdominal pain: Secondary | ICD-10-CM

## 2017-09-10 MED ORDER — IOPAMIDOL (ISOVUE-300) INJECTION 61%
100.0000 mL | Freq: Once | INTRAVENOUS | Status: AC | PRN
  Administered 2017-09-10: 100 mL via INTRAVENOUS

## 2017-09-11 ENCOUNTER — Encounter: Payer: Self-pay | Admitting: Nurse Practitioner

## 2017-09-30 ENCOUNTER — Encounter (INDEPENDENT_AMBULATORY_CARE_PROVIDER_SITE_OTHER): Payer: Self-pay | Admitting: Orthopedic Surgery

## 2017-10-01 ENCOUNTER — Telehealth (INDEPENDENT_AMBULATORY_CARE_PROVIDER_SITE_OTHER): Payer: Self-pay

## 2017-10-01 NOTE — Telephone Encounter (Signed)
Called and lm on vm to advise for her to come in today at 2pm. Advised not to wear the sock until eval by Dr. Sharol Given. Advised to call and let us know if she will be able to come in today at 2pm.

## 2017-10-01 NOTE — Telephone Encounter (Signed)
Patient had LM on triage phone stating she is a patient of Dr Jess Barters and she has been experiencing increased swelling in both of her legs and at times her legs are turning purple.  She still tries to wear compression stockings but she stated that they leave rings around her legs at the top.  I advised her per the response to the my chart message to schedule appt. She was requesting something today (even though this has been going on for quite sometime) because she is leaving Friday afternoon to drive [id number]. I advised her that I did not have an appt for her today since he is coming in from surgery and his schedule is already so full, so she stated will call later to schedule appt. She wanted to know if she should continue with stockings since they are making rings around her legs making her feel like they are cutting off her circulation-I advised likely not but would have you call her to further discuss and also see if you felt she should be seen sooner than returning from out of town. Contact for patient is [phone]

## 2017-10-02 ENCOUNTER — Encounter (INDEPENDENT_AMBULATORY_CARE_PROVIDER_SITE_OTHER): Payer: Self-pay | Admitting: Family

## 2017-10-02 ENCOUNTER — Ambulatory Visit (INDEPENDENT_AMBULATORY_CARE_PROVIDER_SITE_OTHER): Payer: 59 | Admitting: Family

## 2017-10-02 VITALS — Ht 64.0 in | Wt 218.0 lb

## 2017-10-02 DIAGNOSIS — I872 Venous insufficiency (chronic) (peripheral): Secondary | ICD-10-CM

## 2017-10-02 DIAGNOSIS — M5417 Radiculopathy, lumbosacral region: Secondary | ICD-10-CM | POA: Diagnosis not present

## 2017-10-02 MED ORDER — PREDNISONE 10 MG PO TABS: 10.0000 mg | ORAL_TABLET | Freq: Every day | ORAL | 0 refills | Status: DC

## 2017-10-02 NOTE — Telephone Encounter (Signed)
I called pt and she states that she did not get my message about her appt yesterday. Advised that she can come in today at 3pm.

## 2017-10-02 NOTE — Telephone Encounter (Signed)
Can you please open time at or around 3pm for this pt to see Junie Panning please?
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
## 2017-11-03 ENCOUNTER — Other Ambulatory Visit (INDEPENDENT_AMBULATORY_CARE_PROVIDER_SITE_OTHER): Payer: Self-pay | Admitting: Family

## 2017-11-04 NOTE — Telephone Encounter (Signed)
Ok refill 30 for 10mg  per day as needed

## 2017-11-04 NOTE — Telephone Encounter (Signed)
Ok for refill? 

## 2017-11-07 ENCOUNTER — Other Ambulatory Visit: Payer: Self-pay

## 2017-11-07 DIAGNOSIS — G2581 Restless legs syndrome: Secondary | ICD-10-CM

## 2017-11-07 MED ORDER — PRAMIPEXOLE DIHYDROCHLORIDE 1.5 MG PO TABS: 1.5000 mg | ORAL_TABLET | Freq: Three times a day (TID) | ORAL | 0 refills | Status: DC

## 2018-01-15 ENCOUNTER — Telehealth: Payer: Self-pay

## 2018-01-15 ENCOUNTER — Other Ambulatory Visit: Payer: Self-pay | Admitting: Nurse Practitioner

## 2018-01-15 DIAGNOSIS — Z09 Encounter for follow-up examination after completed treatment for conditions other than malignant neoplasm: Secondary | ICD-10-CM

## 2018-01-15 DIAGNOSIS — N6310 Unspecified lump in the right breast, unspecified quadrant: Secondary | ICD-10-CM

## 2018-01-15 NOTE — Telephone Encounter (Signed)
Suppose to have recheck Mammogram I called [facility] they said I suppose to go to [facility]  [facility] said I'm suppose to go to [facility]   Please help

## 2018-01-15 NOTE — Telephone Encounter (Signed)
Pt informed breast ultrasound is scheduled at [facility] on 10/08 at 3:50.

## 2018-01-21 ENCOUNTER — Other Ambulatory Visit (HOSPITAL_COMMUNITY): Payer: BLUE CROSS/BLUE SHIELD

## 2018-01-23 ENCOUNTER — Other Ambulatory Visit: Payer: Self-pay | Admitting: Nurse Practitioner

## 2018-01-23 DIAGNOSIS — R928 Other abnormal and inconclusive findings on diagnostic imaging of breast: Secondary | ICD-10-CM

## 2018-01-28 ENCOUNTER — Ambulatory Visit (HOSPITAL_COMMUNITY): Payer: BLUE CROSS/BLUE SHIELD

## 2018-01-28 ENCOUNTER — Ambulatory Visit (HOSPITAL_COMMUNITY)
Admission: RE | Admit: 2018-01-28 | Discharge: 2018-01-28 | Disposition: A | Discharge status: Home / Self Care | Payer: 59 | Source: Ambulatory Visit | Attending: Nurse Practitioner | Admitting: Nurse Practitioner

## 2018-01-28 DIAGNOSIS — R928 Other abnormal and inconclusive findings on diagnostic imaging of breast: Secondary | ICD-10-CM | POA: Diagnosis not present

## 2018-02-04 ENCOUNTER — Other Ambulatory Visit (INDEPENDENT_AMBULATORY_CARE_PROVIDER_SITE_OTHER): Payer: Self-pay | Admitting: Orthopedic Surgery

## 2018-02-04 ENCOUNTER — Other Ambulatory Visit: Payer: Self-pay | Admitting: Nurse Practitioner

## 2018-02-04 DIAGNOSIS — G2581 Restless legs syndrome: Secondary | ICD-10-CM

## 2018-03-20 ENCOUNTER — Encounter: Payer: Self-pay | Admitting: Obstetrics and Gynecology

## 2018-03-26 ENCOUNTER — Encounter: Payer: Self-pay | Admitting: Obstetrics and Gynecology

## 2018-03-26 ENCOUNTER — Other Ambulatory Visit: Payer: Self-pay

## 2018-03-26 ENCOUNTER — Ambulatory Visit (INDEPENDENT_AMBULATORY_CARE_PROVIDER_SITE_OTHER): Payer: 59 | Admitting: Obstetrics and Gynecology

## 2018-03-26 VITALS — BP 126/84 | HR 72 | Ht 65.0 in | Wt 238.2 lb

## 2018-03-26 DIAGNOSIS — M6289 Other specified disorders of muscle: Secondary | ICD-10-CM

## 2018-03-26 DIAGNOSIS — R102 Pelvic and perineal pain: Secondary | ICD-10-CM

## 2018-03-26 DIAGNOSIS — N3946 Mixed incontinence: Secondary | ICD-10-CM

## 2018-03-26 DIAGNOSIS — N83201 Unspecified ovarian cyst, right side: Secondary | ICD-10-CM

## 2018-03-26 DIAGNOSIS — R109 Unspecified abdominal pain: Secondary | ICD-10-CM

## 2018-03-26 DIAGNOSIS — N941 Unspecified dyspareunia: Secondary | ICD-10-CM

## 2018-03-26 DIAGNOSIS — R197 Diarrhea, unspecified: Secondary | ICD-10-CM

## 2018-03-26 NOTE — Patient Instructions (Signed)
Kegel Exercises Kegel exercises help strengthen the muscles that support the rectum, vagina, small intestine, bladder, and uterus. Doing Kegel exercises can help:  Improve bladder and bowel control.  Improve sexual response.  Reduce problems and discomfort during pregnancy.  Kegel exercises involve squeezing your pelvic floor muscles, which are the same muscles you squeeze when you try to stop the flow of urine. The exercises can be done while sitting, standing, or lying down, but it is best to vary your position. Phase 1 exercises 1. Squeeze your pelvic floor muscles tight. You should feel a tight lift in your rectal area. If you are a female, you should also feel a tightness in your vaginal area. Keep your stomach, buttocks, and legs relaxed. 2. Hold the muscles tight for up to 10 seconds. 3. Relax your muscles. Repeat this exercise 50 times a day or as many times as told by your health care provider. Continue to do this exercise for at least 4-6 weeks or for as long as told by your health care provider. This information is not intended to replace advice given to you by your health care provider. Make sure you discuss any questions you have with your health care provider. Document Released: 03/19/2012 Document Revised: 11/26/2015 Document Reviewed: 02/20/2015 Elsevier Interactive Patient Education  2018 [employer]. Urinary Incontinence Urinary incontinence is the involuntary loss of urine from your bladder. What are the causes? There are many causes of urinary incontinence. They include:  Medicines.  Infections.  Prostatic enlargement, leading to overflow of urine from your bladder.  Surgery.  Neurological diseases.  Emotional factors.  What are the signs or symptoms? Urinary Incontinence can be divided into four types: 4. Urge incontinence. Urge incontinence is the involuntary loss of urine before you have the opportunity to go to the bathroom. There is a sudden urge to  void but not enough time to reach a bathroom. 5. Stress incontinence. Stress incontinence is the sudden loss of urine with any activity that forces urine to pass. It is commonly caused by anatomical changes to the pelvis and sphincter areas of your body. 6. Overflow incontinence. Overflow incontinence is the loss of urine from an obstructed opening to your bladder. This results in a backup of urine and a resultant buildup of pressure within the bladder. When the pressure within the bladder exceeds the closing pressure of the sphincter, the urine overflows, which causes incontinence, similar to water overflowing a dam. 7. Total incontinence. Total incontinence is the loss of urine as a result of the inability to store urine within your bladder.  How is this diagnosed? Evaluating the cause of incontinence may require:  A thorough and complete medical and obstetric history.  A complete physical exam.  Laboratory tests such as a urine culture and sensitivities.  When additional tests are indicated, they can include:  An ultrasound exam.  Kidney and bladder X-rays.  Cystoscopy. This is an exam of the bladder using a narrow scope.  Urodynamic testing to test the nerve function to the bladder and sphincter areas.  How is this treated? Treatment for urinary incontinence depends on the cause:  For urge incontinence caused by a bacterial infection, antibiotics will be prescribed. If the urge incontinence is related to medicines you take, your health care provider may have you change the medicine.  For stress incontinence, surgery to re-establish anatomical support to the bladder or sphincter, or both, will often correct the condition.  For overflow incontinence caused by an enlarged prostate, an operation  to open the channel through the enlarged prostate will allow the flow of urine out of the bladder. In women with fibroids, a hysterectomy may be recommended.  For total incontinence, surgery  on your urinary sphincter may help. An artificial urinary sphincter (an inflatable cuff placed around the urethra) may be required. In women who have developed a hole-like passage between their bladder and vagina (vesicovaginal fistula), surgery to close the fistula often is required.  Follow these instructions at home:  Normal daily hygiene and the use of pads or adult diapers that are changed regularly will help prevent odors and skin damage.  Avoid caffeine. It can overstimulate your bladder.  Use the bathroom regularly. Try about every 2-3 hours to go to the bathroom, even if you do not feel the need to do so. Take time to empty your bladder completely. After urinating, wait a minute. Then try to urinate again.  For causes involving nerve dysfunction, keep a log of the medicines you take and a journal of the times you go to the bathroom. Contact a health care provider if:  You experience worsening of pain instead of improvement in pain after your procedure.  Your incontinence becomes worse instead of better. Get help right away if:  You experience fever or shaking chills.  You are unable to pass your urine.  You have redness spreading into your groin or down into your thighs. This information is not intended to replace advice given to you by your health care provider. Make sure you discuss any questions you have with your health care provider. Document Released: 05/10/2004 Document Revised: 11/11/2015 Document Reviewed: 09/09/2012 Elsevier Interactive Patient Education  [employer].
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
## 2018-03-27 ENCOUNTER — Telehealth: Payer: Self-pay | Admitting: Obstetrics and Gynecology

## 2018-03-27 LAB — URINALYSIS, MICROSCOPIC ONLY
Bacteria, UA: NONE SEEN
CASTS: NONE SEEN /LPF

## 2018-03-27 NOTE — Telephone Encounter (Signed)
Responded to patient via mychart.   Ms. Margaret Cruz,  My name is Margaret Cruz and I am a triage nurse with Dr. Talbert Nan.  I have reviewed Dr. Gentry Fitz notes and she did note that she reviewed your CT scan.The Gynecology ultrasound that she ordered for you and you have scheduled will help Dr. Talbert Nan to be able to view pictures of your uterus, ovaries, endometrium much more closely than the CT scan results and be able to measure the anatomy in a more detailed manner.  I hope this helps answer your question. Please let us know if you need anything further.

## 2018-03-27 NOTE — Telephone Encounter (Signed)
Patient sent the following correspondence through [facility]. Routing to triage to assist patient with request.  Good evening, is there a way for you to look at my ct scan from Dr. Brantley Stage it is in my charts and see if this will help with what we discussed at my appointment. I have a lot of ct scan results in my file and just wonder if these will help.

## 2018-03-28 ENCOUNTER — Telehealth: Payer: Self-pay | Admitting: Obstetrics and Gynecology

## 2018-03-28 LAB — URINE CULTURE

## 2018-03-28 NOTE — Telephone Encounter (Signed)
Patient sent the following correspondence through [facility]. Routing to triage to assist patient with request.  I was just wondering since Dr. Talbert Nan knows everything that is going on the only way I can get approved for any Dr appointments from any Dr. is to file intermediate FMLA, I was wondering if maybe I could get her to fill these papers out for me? Thanks Anderson Malta    ----- Message -----  From: Nurse Kallie Edward  Sent: 03/27/18, 8:23 AM  To: Donalynn Furlong Hoefling  Subject: RE: Non-Urgent Medical Question    Ms. Leisinger,   My name is Olivia Mackie and I am a triage nurse with Dr. Talbert Nan.   I have reviewed Dr. Gentry Fitz notes and she did note that she reviewed your CT scan. The Gynecology ultrasound that she ordered for you and you have scheduled will help Dr. Talbert Nan to be able to view pictures of your uterus, ovaries, endometrium much more closely than the CT scan results and be able to measure the anatomy in a more detailed manner.     I hope this helps answer your question. Please let us know if you need anything further.     Sincerely,   Edrick Kins , BSN RN-BC  Triage Nurse  [facility]  [address], [place]  [place], [place] [address]  [facility]: [phone]; Fax: [phone]      ----- Message -----   From: Barb Merino   Sent: 03/26/2018 6:27 PM EST    To: Salvadore Dom, MD  Subject: Non-Urgent Medical Question    Good evening, is there a way for you to look at my ct scan from Dr. Brantley Stage it is in my charts and see if this will help with what we discussed at my appointment. I have a lot of ct scan results in my file and just wonder if these will help.

## 2018-03-31 ENCOUNTER — Telehealth: Payer: Self-pay | Admitting: Obstetrics and Gynecology

## 2018-03-31 NOTE — Telephone Encounter (Signed)
RE: Non-Urgent Medical Question  Message [id number]  From Barb Merino To Salvadore Dom, MD Sent 03/31/2018 11:27 AM  okay thanks   Previous Messages    ----- Message -----  From: Nurse Kallie Edward  Sent: 03/31/18, 10:28 AM  To: Barb Merino  Subject: RE: Non-Urgent Medical Question   Ms. Pepitone,  Dr. Talbert Nan is in surgery today, so I may not be able to get an answer from her today but I think this is a good question to discuss with her tomorrow at your ultrasound appointment.  Our physicians here only are able to write for FMLA if related to gynecology appointments only. If you have paperwork available bring it with you tomorrow as well.  Sincerely,  Olivia Mackie   ----- Message -----   From: Barb Merino   Sent: 03/28/2018 10:47 AM EST    To: Salvadore Dom, MD  Subject: RE: Non-Urgent Medical Question   I was just wondering since Dr. Talbert Nan knows everything that is going on the only way I can get approved for any Dr appointments from any Dr. is to file intermediate FMLA, I was wondering if maybe I could get her to fill these papers out for me? Thanks Anderson Malta   ----- Message -----  From: Nurse Kallie Edward  Sent: 03/27/18, 8:23 AM  To: Donalynn Furlong Neenan  Subject: RE: Non-Urgent Medical Question   Ms. Tumbleson,  My name is Olivia Mackie and I am a triage nurse with Dr. Talbert Nan.  I have reviewed Dr. Gentry Fitz notes and she did note that she reviewed your CT scan. The Gynecology ultrasound that she ordered for you and you have scheduled will help Dr. Talbert Nan to be able to view pictures of your uterus, ovaries, endometrium much more closely than the CT scan results and be able to measure the anatomy in a more detailed manner.    I hope this helps answer your question. Please let us know if you need anything further.   Sincerely,  Edrick Kins , BSN RN-BC  Triage Nurse  [facility]  [address] [place]   [place], [place] [address]  [facility]: [phone]; Fax: [phone]    ----- Message -----   From: Barb Merino   Sent: 03/26/2018 6:27 PM EST    To: Salvadore Dom, MD  Subject: Non-Urgent Medical Question   Good evening, is there a way for you to look at my ct scan from Dr. Brantley Stage it is in my charts and see if this will help with what we discussed at my appointment. I have a lot of ct scan results in my file and just wonder if these will help.

## 2018-03-31 NOTE — Telephone Encounter (Signed)
Responded to patient via mychart.  Advised FMLA only for GYN issues.  I am not sure if patient request FMLA for her other health concerns as well.  Advised her to discuss with Dr. Talbert Nan at office visit she has tomorrow.  Encounter to Dr. Talbert Nan and will close.

## 2018-04-01 ENCOUNTER — Ambulatory Visit (INDEPENDENT_AMBULATORY_CARE_PROVIDER_SITE_OTHER): Payer: 59 | Admitting: Obstetrics and Gynecology

## 2018-04-01 ENCOUNTER — Ambulatory Visit (INDEPENDENT_AMBULATORY_CARE_PROVIDER_SITE_OTHER): Payer: 59

## 2018-04-01 ENCOUNTER — Encounter: Payer: Self-pay | Admitting: Obstetrics and Gynecology

## 2018-04-01 VITALS — BP 110/78 | HR 80 | Resp 16 | Ht 65.0 in | Wt 238.0 lb

## 2018-04-01 DIAGNOSIS — N83202 Unspecified ovarian cyst, left side: Secondary | ICD-10-CM

## 2018-04-01 DIAGNOSIS — R102 Pelvic and perineal pain: Secondary | ICD-10-CM

## 2018-04-01 DIAGNOSIS — N941 Unspecified dyspareunia: Secondary | ICD-10-CM | POA: Diagnosis not present

## 2018-04-01 DIAGNOSIS — N83201 Unspecified ovarian cyst, right side: Secondary | ICD-10-CM

## 2018-04-01 DIAGNOSIS — R109 Unspecified abdominal pain: Secondary | ICD-10-CM
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
## 2018-04-04 ENCOUNTER — Telehealth: Payer: Self-pay | Admitting: Obstetrics and Gynecology

## 2018-04-04 ENCOUNTER — Encounter: Payer: Self-pay | Admitting: Obstetrics and Gynecology

## 2018-04-04 NOTE — Telephone Encounter (Signed)
Patient sent the following correspondence through [facility]. Routing to triage to assist patient with request.  Good afternoon, I was wondering if I bring a Medical Accommodation Form if I could get DrJertson to fill out so I can go th therapy that she recommended for me?

## 2018-04-10 NOTE — Telephone Encounter (Signed)
Hi Ms. Dikes,  Dennis Bast can bring the form by and Dr. Bryon Lions can review it. There may be a charge to complete the form. The receptionists can help you with that information. The office is open until 5 pm each day.  Thank you,  Karen Chafe, BSN RN-BC  Triage Nurse  [facility]  [address] [place]  [place],  [address]  [facility]: [phone]; Fax: [phone]    Last read by Barb Merino at 8:15 AM on 04/10/2018.

## 2018-04-17 ENCOUNTER — Other Ambulatory Visit: Payer: Self-pay | Admitting: Nurse Practitioner

## 2018-04-17 DIAGNOSIS — G2581 Restless legs syndrome: Secondary | ICD-10-CM

## 2018-04-18 NOTE — Telephone Encounter (Signed)
Last seen 07/2017

## 2018-04-23 ENCOUNTER — Telehealth: Payer: Self-pay | Admitting: Obstetrics and Gynecology

## 2018-04-23 NOTE — Telephone Encounter (Signed)
Call placed in reference to referral to [facility]. °

## 2018-04-25 NOTE — Telephone Encounter (Signed)
Call placed in reference to a referral to [facility].

## 2018-04-25 NOTE — Telephone Encounter (Signed)
°  Dr. Talbert Nan,   I have spoken with the patient and she has declined the referral at this time. She states she will call back when she is able to go to the doctor. Okay to close the referral? Karin Lieu Per Dr.Jertson to close the referral.

## 2018-05-21 ENCOUNTER — Telehealth: Payer: Self-pay | Admitting: Obstetrics and Gynecology

## 2018-05-21 NOTE — Telephone Encounter (Signed)
Dr. Talbert Nan,  Margaret Cruz declined physical therapy and requested to see the urologist. She was scheduled for 04/17/18 with Dr. Matilde Sprang at [facility]. She did not keep her appointment(no-show).    [name] called me today she wants to know how you wanted to proceed with the referral.   [name]

## 2018-05-23 NOTE — Telephone Encounter (Signed)
Close the referral. Thanks

## 2018-05-29 ENCOUNTER — Ambulatory Visit (INDEPENDENT_AMBULATORY_CARE_PROVIDER_SITE_OTHER): Payer: 59 | Admitting: Physician Assistant

## 2018-06-02 ENCOUNTER — Ambulatory Visit (INDEPENDENT_AMBULATORY_CARE_PROVIDER_SITE_OTHER): Payer: No Typology Code available for payment source | Admitting: Orthopedic Surgery

## 2018-06-02 ENCOUNTER — Ambulatory Visit (INDEPENDENT_AMBULATORY_CARE_PROVIDER_SITE_OTHER): Payer: Commercial Managed Care - PPO

## 2018-06-02 DIAGNOSIS — S72001S Fracture of unspecified part of neck of right femur, sequela: Secondary | ICD-10-CM | POA: Diagnosis not present

## 2018-06-02 DIAGNOSIS — S92011S Displaced fracture of body of right calcaneus, sequela: Secondary | ICD-10-CM | POA: Diagnosis not present
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
## 2018-06-10 ENCOUNTER — Encounter (INDEPENDENT_AMBULATORY_CARE_PROVIDER_SITE_OTHER): Payer: Self-pay | Admitting: Orthopedic Surgery

## 2018-07-03 ENCOUNTER — Other Ambulatory Visit: Payer: Self-pay

## 2018-07-03 ENCOUNTER — Other Ambulatory Visit: Payer: Self-pay | Admitting: Nurse Practitioner

## 2018-07-03 DIAGNOSIS — G2581 Restless legs syndrome: Secondary | ICD-10-CM

## 2018-07-04 MED ORDER — PRAMIPEXOLE DIHYDROCHLORIDE 1.5 MG PO TABS: 1.5000 mg | ORAL_TABLET | Freq: Three times a day (TID) | ORAL | 0 refills | Status: DC

## 2018-07-22 HISTORY — PX: TOTAL HIP ARTHROPLASTY: SHX124

## 2018-09-01 ENCOUNTER — Other Ambulatory Visit: Payer: Self-pay | Admitting: Nurse Practitioner

## 2018-09-01 DIAGNOSIS — G2581 Restless legs syndrome: Secondary | ICD-10-CM

## 2018-10-07 ENCOUNTER — Telehealth: Payer: Self-pay | Admitting: Nurse Practitioner

## 2018-10-07 NOTE — Telephone Encounter (Signed)
appt made for 10/10/18

## 2018-10-07 NOTE — Telephone Encounter (Signed)
Needs to be seen

## 2018-10-07 NOTE — Telephone Encounter (Signed)
REFERRAL REQUEST Telephone Note  What type of referral do you need? Dx mammogram  Have you been seen at our office for this problem? No, pt is due for her annual dx mammogram (Advise that they will likely need an appointment with their PCP before a referral can be done)  Is there a particular doctor or location that you prefer? [facility]  Patient notified that referrals can take up to a week or longer to process. If they haven't heard anything within a week they should call back and speak with the referral department.

## 2018-10-09 ENCOUNTER — Other Ambulatory Visit: Payer: Self-pay

## 2018-10-10 ENCOUNTER — Ambulatory Visit (INDEPENDENT_AMBULATORY_CARE_PROVIDER_SITE_OTHER): Payer: No Typology Code available for payment source | Admitting: Nurse Practitioner

## 2018-10-10 ENCOUNTER — Encounter: Payer: Self-pay | Admitting: Nurse Practitioner

## 2018-10-10 VITALS — BP 105/70 | HR 75 | Temp 97.6°F | Ht 65.0 in | Wt 237.0 lb

## 2018-10-10 DIAGNOSIS — G43109 Migraine with aura, not intractable, without status migrainosus: Secondary | ICD-10-CM

## 2018-10-10 DIAGNOSIS — Z0001 Encounter for general adult medical examination with abnormal findings: Secondary | ICD-10-CM | POA: Diagnosis not present

## 2018-10-10 DIAGNOSIS — G2581 Restless legs syndrome: Secondary | ICD-10-CM

## 2018-10-10 DIAGNOSIS — R928 Other abnormal and inconclusive findings on diagnostic imaging of breast: Secondary | ICD-10-CM | POA: Diagnosis not present

## 2018-10-10 DIAGNOSIS — Z0000 Encounter for general adult medical examination without abnormal findings: Secondary | ICD-10-CM
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
## 2018-10-11 LAB — CMP14+EGFR
ALT: 17 IU/L (ref 0–32)
AST: 12 IU/L (ref 0–40)
Albumin/Globulin Ratio: 1.7 (ref 1.2–2.2)
Albumin: 4 g/dL (ref 3.8–4.8)
Alkaline Phosphatase: 57 IU/L (ref 39–117)
BUN/Creatinine Ratio: 11 (ref 9–23)
BUN: 9 mg/dL (ref 6–24)
Bilirubin Total: 0.8 mg/dL (ref 0.0–1.2)
CO2: 22 mmol/L (ref 20–29)
Calcium: 8.5 mg/dL — ABNORMAL LOW (ref 8.7–10.2)
Chloride: 103 mmol/L (ref 96–106)
Creatinine, Ser: 0.85 mg/dL (ref 0.57–1.00)
GFR calc Af Amer: 94 mL/min/{1.73_m2} (ref >59)
GFR calc non Af Amer: 82 mL/min/{1.73_m2} (ref >59)
Globulin, Total: 2.4 g/dL (ref 1.5–4.5)
Glucose: 89 mg/dL (ref 65–99)
Potassium: 3.8 mmol/L (ref 3.5–5.2)
Sodium: 140 mmol/L (ref 134–144)
Total Protein: 6.4 g/dL (ref 6.0–8.5)

## 2018-10-11 LAB — LIPID PANEL
Chol/HDL Ratio: 2.7 ratio (ref 0.0–4.4)
Cholesterol, Total: 182 mg/dL (ref 100–199)
HDL: 68 mg/dL (ref >39)
LDL Calculated: 93 mg/dL (ref 0–99)
Triglycerides: 103 mg/dL (ref 0–149)
VLDL Cholesterol Cal: 21 mg/dL (ref 5–40)

## 2018-10-11 LAB — CBC WITH DIFFERENTIAL/PLATELET
Basophils Absolute: 0 10*3/uL (ref 0.0–0.2)
Basos: 0 %
EOS (ABSOLUTE): 0.1 10*3/uL (ref 0.0–0.4)
Eos: 2 %
Hematocrit: 40.2 % (ref 34.0–46.6)
Hemoglobin: 13.6 g/dL (ref 11.1–15.9)
Immature Grans (Abs): 0 10*3/uL (ref 0.0–0.1)
Immature Granulocytes: 0 %
Lymphocytes Absolute: 1.8 10*3/uL (ref 0.7–3.1)
Lymphs: 33 %
MCH: 28.9 pg (ref 26.6–33.0)
MCHC: 33.8 g/dL (ref 31.5–35.7)
MCV: 86 fL (ref 79–97)
Monocytes Absolute: 0.4 10*3/uL (ref 0.1–0.9)
Monocytes: 7 %
Neutrophils Absolute: 3.1 10*3/uL (ref 1.4–7.0)
Neutrophils: 58 %
Platelets: 230 10*3/uL (ref 150–450)
RBC: 4.7 x10E6/uL (ref 3.77–5.28)
RDW: 12.5 % (ref 11.7–15.4)
WBC: 5.4 10*3/uL (ref 3.4–10.8)

## 2018-10-11 LAB — THYROID PANEL WITH TSH
Free Thyroxine Index: 2.6 (ref 1.2–4.9)
T3 Uptake Ratio: 21 % — ABNORMAL LOW (ref 24–39)
T4, Total: 12.3 ug/dL — ABNORMAL HIGH (ref 4.5–12.0)
TSH: 1.34 u[IU]/mL (ref 0.450–4.500)

## 2018-10-20 ENCOUNTER — Other Ambulatory Visit (HOSPITAL_COMMUNITY): Payer: Self-pay | Admitting: Nurse Practitioner

## 2018-10-20 DIAGNOSIS — R928 Other abnormal and inconclusive findings on diagnostic imaging of breast: Secondary | ICD-10-CM

## 2018-10-21 ENCOUNTER — Other Ambulatory Visit (HOSPITAL_COMMUNITY): Payer: 59

## 2018-10-21 ENCOUNTER — Encounter (HOSPITAL_COMMUNITY): Payer: 59

## 2018-10-22 ENCOUNTER — Other Ambulatory Visit: Payer: Self-pay | Admitting: Nurse Practitioner

## 2018-10-22 DIAGNOSIS — G2581 Restless legs syndrome: Secondary | ICD-10-CM

## 2018-10-28 ENCOUNTER — Other Ambulatory Visit (HOSPITAL_COMMUNITY): Payer: Self-pay | Admitting: Internal Medicine

## 2018-10-28 ENCOUNTER — Ambulatory Visit (HOSPITAL_COMMUNITY)
Admission: RE | Admit: 2018-10-28 | Discharge: 2018-10-28 | Disposition: A | Discharge status: Home / Self Care | Payer: No Typology Code available for payment source | Source: Ambulatory Visit | Attending: Nurse Practitioner | Admitting: Nurse Practitioner

## 2018-10-28 ENCOUNTER — Ambulatory Visit (HOSPITAL_COMMUNITY): Admission: RE | Admit: 2018-10-28 | Payer: No Typology Code available for payment source | Source: Ambulatory Visit

## 2018-10-28 ENCOUNTER — Other Ambulatory Visit (HOSPITAL_COMMUNITY): Payer: 59

## 2018-10-28 ENCOUNTER — Other Ambulatory Visit: Payer: Self-pay

## 2018-10-28 ENCOUNTER — Encounter (HOSPITAL_COMMUNITY): Payer: Self-pay

## 2018-10-28 DIAGNOSIS — R928 Other abnormal and inconclusive findings on diagnostic imaging of breast: Secondary | ICD-10-CM | POA: Diagnosis not present

## 2018-11-04 ENCOUNTER — Ambulatory Visit (INDEPENDENT_AMBULATORY_CARE_PROVIDER_SITE_OTHER): Payer: No Typology Code available for payment source | Admitting: Nurse Practitioner

## 2018-11-04 ENCOUNTER — Encounter: Payer: Self-pay | Admitting: Nurse Practitioner

## 2018-11-04 ENCOUNTER — Other Ambulatory Visit: Payer: Self-pay

## 2018-11-04 VITALS — BP 121/75 | HR 62 | Temp 97.5°F | Ht 65.0 in | Wt 239.0 lb

## 2018-11-04 DIAGNOSIS — N3000 Acute cystitis without hematuria: Secondary | ICD-10-CM

## 2018-11-04 DIAGNOSIS — R300 Dysuria: Secondary | ICD-10-CM | POA: Diagnosis not present

## 2018-11-04 LAB — URINALYSIS, COMPLETE
Bilirubin, UA: NEGATIVE
Glucose, UA: NEGATIVE
Ketones, UA: NEGATIVE
Nitrite, UA: NEGATIVE
Protein,UA: NEGATIVE
Specific Gravity, UA: 1.025 (ref 1.005–1.030)
Urobilinogen, Ur: 0.2 mg/dL (ref 0.2–1.0)
pH, UA: 6.5 (ref 5.0–7.5)

## 2018-11-04 LAB — MICROSCOPIC EXAMINATION
Epithelial Cells (non renal): >10 /hpf — AB (ref 0–10)
Renal Epithel, UA: NONE SEEN /hpf

## 2018-11-04 MED ORDER — CIPROFLOXACIN HCL 500 MG PO TABS: 500.0000 mg | ORAL_TABLET | Freq: Two times a day (BID) | ORAL | 0 refills | Status: DC

## 2018-11-04 NOTE — Patient Instructions (Signed)
Urinary Tract Infection, Adult A urinary tract infection (UTI) is an infection of any part of the urinary tract. The urinary tract includes:  The kidneys.  The ureters.  The bladder.  The urethra. These organs make, store, and get rid of pee (urine) in the body. What are the causes? This is caused by germs (bacteria) in your genital area. These germs grow and cause swelling (inflammation) of your urinary tract. What increases the risk? You are more likely to develop this condition if:  You have a small, thin tube (catheter) to drain pee.  You cannot control when you pee or poop (incontinence).  You are female, and: ? You use these methods to prevent pregnancy: ? A medicine that kills sperm (spermicide). ? A device that blocks sperm (diaphragm). ? You have low levels of a female hormone (estrogen). ? You are pregnant.  You have genes that add to your risk.  You are sexually active.  You take antibiotic medicines.  You have trouble peeing because of: ? A prostate that is bigger than normal, if you are female. ? A blockage in the part of your body that drains pee from the bladder (urethra). ? A kidney stone. ? A nerve condition that affects your bladder (neurogenic bladder). ? Not getting enough to drink. ? Not peeing often enough.  You have other conditions, such as: ? Diabetes. ? A weak disease-fighting system (immune system). ? Sickle cell disease. ? Gout. ? Injury of the spine. What are the signs or symptoms? Symptoms of this condition include:  Needing to pee right away (urgently).  Peeing often.  Peeing small amounts often.  Pain or burning when peeing.  Blood in the pee.  Pee that smells bad or not like normal.  Trouble peeing.  Pee that is cloudy.  Fluid coming from the vagina, if you are female.  Pain in the belly or lower back. Other symptoms include:  Throwing up (vomiting).  No urge to eat.  Feeling mixed up (confused).  Being tired  and grouchy (irritable).  A fever.  Watery poop (diarrhea). How is this treated? This condition may be treated with:  Antibiotic medicine.  Other medicines.  Drinking enough water. Follow these instructions at home:  Medicines  Take over-the-counter and prescription medicines only as told by your doctor.  If you were prescribed an antibiotic medicine, take it as told by your doctor. Do not stop taking it even if you start to feel better. General instructions  Make sure you: ? Pee until your bladder is empty. ? Do not hold pee for a long time. ? Empty your bladder after sex. ? Wipe from front to back after pooping if you are a female. Use each tissue one time when you wipe.  Drink enough fluid to keep your pee pale yellow.  Keep all follow-up visits as told by your doctor. This is important. Contact a doctor if:  You do not get better after 1-2 days.  Your symptoms go away and then come back. Get help right away if:  You have very bad back pain.  You have very bad pain in your lower belly.  You have a fever.  You are sick to your stomach (nauseous).  You are throwing up. Summary  A urinary tract infection (UTI) is an infection of any part of the urinary tract.  This condition is caused by germs in your genital area.  There are many risk factors for a UTI. These include having a small, thin   tube to drain pee and not being able to control when you pee or poop.  Treatment includes antibiotic medicines for germs.  Drink enough fluid to keep your pee pale yellow. This information is not intended to replace advice given to you by your health care provider. Make sure you discuss any questions you have with your health care provider. Document Released: 09/19/2007 Document Revised: 03/20/2018 Document Reviewed: 10/10/2017 Elsevier Patient Education  2020 Elsevier Inc.  

## 2018-11-04 NOTE — Progress Notes (Signed)
   Subjective:    Patient ID: Margaret Cruz, female    DOB: 1970/10/10, 48 y.o.   MRN: [id number]   Chief Complaint: Dysuria   HPI Patient come sin today c/o dysuria and frequency. Started yesterday. Had to get up several times during the night to void.    Review of Systems  Constitutional: Negative.   HENT: Negative.   Respiratory: Negative.   Cardiovascular: Negative.   Genitourinary: Positive for dysuria, frequency and urgency. Negative for flank pain.  Neurological: Negative.   Psychiatric/Behavioral: Negative.   All other systems reviewed and are negative.      Objective:   Physical Exam Vitals signs and nursing note reviewed.  Constitutional:      General: She is not in acute distress.    Appearance: Normal appearance. She is well-developed.  HENT:     Head: Normocephalic.     Nose: Nose normal.  Eyes:     Pupils: Pupils are equal, round, and reactive to light.  Neck:     Musculoskeletal: Normal range of motion and neck supple.     Vascular: No carotid bruit or JVD.  Cardiovascular:     Rate and Rhythm: Normal rate and regular rhythm.     Heart sounds: Normal heart sounds.  Pulmonary:     Effort: Pulmonary effort is normal. No respiratory distress.     Breath sounds: Normal breath sounds. No wheezing or rales.  Chest:     Chest wall: No tenderness.  Abdominal:     General: Bowel sounds are normal. There is no distension or abdominal bruit.     Palpations: Abdomen is soft. There is no hepatomegaly, splenomegaly, mass or pulsatile mass.     Tenderness: There is no abdominal tenderness. There is no right CVA tenderness or left CVA tenderness.  Musculoskeletal: Normal range of motion.  Lymphadenopathy:     Cervical: No cervical adenopathy.  Skin:    General: Skin is warm and dry.  Neurological:     Mental Status: She is alert and oriented to person, place, and time.     Deep Tendon Reflexes: Reflexes are normal and symmetric.  Psychiatric:      Behavior: Behavior normal.        Thought Content: Thought content normal.        Judgment: Judgment normal.     BP 121/75   Pulse 62   Temp (!) 97.5 F (36.4 C) (Oral)   Ht 5\' 5"  (1.651 m)   Wt 239 lb (108.4 kg)   BMI 39.77 kg/m        Assessment & Plan:  EDLYN ROSENBURG in today with chief complaint of Dysuria   1. Dysuria - Urinalysis, Complete  2. Acute cystitis without hematuria Take medication as prescribe Cotton underwear Take shower not bath Cranberry juice, yogurt Force fluids AZO over the counter X2 days Culture pending RTO prn  - ciprofloxacin (CIPRO) 500 MG tablet; Take 1 tablet (500 mg total) by mouth 2 (two) times daily.  Dispense: 10 tablet; Refill: 0  [name] Done, FNP

## 2018-11-06 LAB — URINE CULTURE

## 2018-11-20 ENCOUNTER — Other Ambulatory Visit: Payer: Self-pay | Admitting: Nurse Practitioner

## 2018-11-20 MED ORDER — ESCITALOPRAM OXALATE 10 MG PO TABS: 10.0000 mg | ORAL_TABLET | Freq: Every day | ORAL | 1 refills | Status: DC

## 2018-12-01 ENCOUNTER — Telehealth: Payer: Self-pay

## 2018-12-01 ENCOUNTER — Ambulatory Visit (INDEPENDENT_AMBULATORY_CARE_PROVIDER_SITE_OTHER): Payer: No Typology Code available for payment source | Admitting: Orthopedic Surgery

## 2018-12-01 ENCOUNTER — Ambulatory Visit (INDEPENDENT_AMBULATORY_CARE_PROVIDER_SITE_OTHER): Payer: No Typology Code available for payment source

## 2018-12-01 ENCOUNTER — Encounter: Payer: Self-pay | Admitting: Orthopedic Surgery

## 2018-12-01 VITALS — Ht 65.0 in | Wt 239.0 lb

## 2018-12-01 DIAGNOSIS — M79671 Pain in right foot: Secondary | ICD-10-CM

## 2018-12-01 DIAGNOSIS — M25871 Other specified joint disorders, right ankle and foot: Secondary | ICD-10-CM

## 2018-12-01 MED ORDER — METHYLPREDNISOLONE ACETATE 40 MG/ML IJ SUSP
40.0000 mg | INTRAMUSCULAR | Status: AC | PRN
  Administered 2018-12-01: 40 mg via INTRA_ARTICULAR

## 2018-12-01 MED ORDER — LIDOCAINE HCL 1 % IJ SOLN
2.0000 mL | INTRAMUSCULAR | Status: AC | PRN
  Administered 2018-12-01: 2 mL
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
## 2018-12-01 NOTE — Telephone Encounter (Signed)
FYI Patient called and LM on triage phone stating she has been having increased ankle pain and has been falling. Tried calling her to schedule an appt. No answer but did LM

## 2018-12-30 ENCOUNTER — Encounter: Payer: Self-pay | Admitting: Orthopedic Surgery

## 2019-01-05 ENCOUNTER — Ambulatory Visit: Payer: No Typology Code available for payment source | Admitting: Orthopedic Surgery

## 2019-01-15 ENCOUNTER — Encounter: Payer: Self-pay | Admitting: Orthopedic Surgery

## 2019-01-15 ENCOUNTER — Ambulatory Visit (INDEPENDENT_AMBULATORY_CARE_PROVIDER_SITE_OTHER): Payer: No Typology Code available for payment source | Admitting: Orthopedic Surgery

## 2019-01-15 VITALS — Ht 65.0 in | Wt 239.0 lb

## 2019-01-15 DIAGNOSIS — M25871 Other specified joint disorders, right ankle and foot: Secondary | ICD-10-CM

## 2019-01-20 ENCOUNTER — Encounter: Payer: Self-pay | Admitting: Orthopedic Surgery
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
## 2019-02-15 ENCOUNTER — Other Ambulatory Visit: Payer: Self-pay | Admitting: Nurse Practitioner

## 2019-02-15 DIAGNOSIS — G2581 Restless legs syndrome: Secondary | ICD-10-CM

## 2019-03-03 ENCOUNTER — Other Ambulatory Visit: Payer: Self-pay

## 2019-03-04 ENCOUNTER — Ambulatory Visit (INDEPENDENT_AMBULATORY_CARE_PROVIDER_SITE_OTHER): Payer: No Typology Code available for payment source | Admitting: Family Medicine

## 2019-03-04 ENCOUNTER — Encounter: Payer: Self-pay | Admitting: Family Medicine

## 2019-03-04 ENCOUNTER — Ambulatory Visit (INDEPENDENT_AMBULATORY_CARE_PROVIDER_SITE_OTHER): Payer: No Typology Code available for payment source

## 2019-03-04 VITALS — BP 114/73 | HR 75 | Temp 97.8°F | Resp 20 | Ht 65.0 in | Wt 236.0 lb

## 2019-03-04 DIAGNOSIS — W19XXXA Unspecified fall, initial encounter: Secondary | ICD-10-CM

## 2019-03-04 DIAGNOSIS — M25562 Pain in left knee: Secondary | ICD-10-CM

## 2019-03-04 DIAGNOSIS — S86912A Strain of unspecified muscle(s) and tendon(s) at lower leg level, left leg, initial encounter: Secondary | ICD-10-CM

## 2019-03-04 MED ORDER — DICLOFENAC SODIUM 1 % EX GEL: 4.0000 g | Freq: Four times a day (QID) | CUTANEOUS | 3 refills | Status: DC

## 2019-03-04 MED ORDER — PREDNISONE 20 MG PO TABS: ORAL_TABLET | ORAL | 0 refills | Status: DC
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
## 2019-03-04 NOTE — Patient Instructions (Addendum)
Acute Knee Pain, Adult Many things can cause knee pain. Sometimes, knee pain is sudden (acute) and may be caused by damage, swelling, or irritation of the muscles and tissues that support your knee. The pain often goes away on its own with time and rest. If the pain does not go away, tests may be done to find out what is causing the pain. Follow these instructions at home: Pay attention to any changes in your symptoms. Take these actions to relieve your pain. If you have a knee sleeve or brace:   Wear the sleeve or brace as told by your doctor. Remove it only as told by your doctor.  Loosen the sleeve or brace if your toes: ? Tingle. ? Become numb. ? Turn cold and blue.  Keep the sleeve or brace clean.  If the sleeve or brace is not waterproof: ? Do not let it get wet. ? Cover it with a watertight covering when you take a bath or shower. Activity  Rest your knee.  Do not do things that cause pain.  Avoid activities where both feet leave the ground at the same time (high-impact activities). Examples are running, jumping rope, and doing jumping jacks.  Work with a physical therapist to make a safe exercise program, as told by your doctor. Managing pain, stiffness, and swelling   If told, put ice on the knee: ? Put ice in a plastic bag. ? Place a towel between your skin and the bag. ? Leave the ice on for 20 minutes, 2-3 times a day.  If told, put pressure (compression) on your injured knee to control swelling, give support, and help with discomfort. Compression may be done with an elastic bandage. General instructions  Take all medicines only as told by your doctor.  Raise (elevate) your knee while you are sitting or lying down. Make sure your knee is higher than your heart.  Sleep with a pillow under your knee.  Do not use any products that contain nicotine or tobacco. These include cigarettes, e-cigarettes, and chewing tobacco. These products may slow down healing. If  you need help quitting, ask your doctor.  If you are overweight, work with your doctor and a food expert (dietitian) to set goals to lose weight. Being overweight can make your knee hurt more.  Keep all follow-up visits as told by your doctor. This is important. Contact a doctor if:  The knee pain does not stop.  The knee pain changes or gets worse.  You have a fever along with knee pain.  Your knee feels warm when you touch it.  Your knee gives out or locks up. Get help right away if:  Your knee swells, and the swelling gets worse.  You cannot move your knee.  You have very bad knee pain. Summary  Many things can cause knee pain. The pain often goes away on its own with time and rest.  Your doctor may do tests to find out the cause of the pain.  Pay attention to any changes in your symptoms. Relieve your pain with rest, medicines, light activity, and use of ice.  Get help right away if you cannot move your knee or your knee pain is very bad. This information is not intended to replace advice given to you by your health care provider. Make sure you discuss any questions you have with your health care provider. Document Released: 06/29/2008 Document Revised: 09/12/2017 Document Reviewed: 09/12/2017 Elsevier Patient Education  2020 [place]  for Nurse Practitioners, 15(4), [phone]. Retrieved January 20, 2018 from http://clinicalkey.com/nursing">  Knee Exercises Ask your health care provider which exercises are safe for you. Do exercises exactly as told by your health care provider and adjust them as directed. It is normal to feel mild stretching, pulling, tightness, or discomfort as you do these exercises. Stop right away if you feel sudden pain or your pain gets worse. Do not begin these exercises until told by your health care provider. Stretching and range-of-motion exercises These exercises warm up your muscles and joints and improve the movement and flexibility  of your knee. These exercises also help to relieve pain and swelling. Knee extension, prone 1. Lie on your abdomen (prone position) on a bed. 2. Place your left / right knee just beyond the edge of the surface so your knee is not on the bed. You can put a towel under your left / right thigh just above your kneecap for comfort. 3. Relax your leg muscles and allow gravity to straighten your knee (extension). You should feel a stretch behind your left / right knee. 4. Hold this position for __________ seconds. 5. Scoot up so your knee is supported between repetitions. Repeat __________ times. Complete this exercise __________ times a day. Knee flexion, active  1. Lie on your back with both legs straight. If this causes back discomfort, bend your left / right knee so your foot is flat on the floor. 2. Slowly slide your left / right heel back toward your buttocks. Stop when you feel a gentle stretch in the front of your knee or thigh (flexion). 3. Hold this position for __________ seconds. 4. Slowly slide your left / right heel back to the starting position. Repeat __________ times. Complete this exercise __________ times a day. Quadriceps stretch, prone  1. Lie on your abdomen on a firm surface, such as a bed or padded floor. 2. Bend your left / right knee and hold your ankle. If you cannot reach your ankle or pant leg, loop a belt around your foot and grab the belt instead. 3. Gently pull your heel toward your buttocks. Your knee should not slide out to the side. You should feel a stretch in the front of your thigh and knee (quadriceps). 4. Hold this position for __________ seconds. Repeat __________ times. Complete this exercise __________ times a day. Hamstring, supine 1. Lie on your back (supine position). 2. Loop a belt or towel over the ball of your left / right foot. The ball of your foot is on the walking surface, right under your toes. 3. Straighten your left / right knee and slowly  pull on the belt to raise your leg until you feel a gentle stretch behind your knee (hamstring). ? Do not let your knee bend while you do this. ? Keep your other leg flat on the floor. 4. Hold this position for __________ seconds. Repeat __________ times. Complete this exercise __________ times a day. Strengthening exercises These exercises build strength and endurance in your knee. Endurance is the ability to use your muscles for a long time, even after they get tired. Quadriceps, isometric This exercise stretches the muscles in front of your thigh (quadriceps) without moving your knee joint (isometric). 1. Lie on your back with your left / right leg extended and your other knee bent. Put a rolled towel or small pillow under your knee if told by your health care provider. 2. Slowly tense the muscles in the front of your left /  right thigh. You should see your kneecap slide up toward your hip or see increased dimpling just above the knee. This motion will push the back of the knee toward the floor. 3. For __________ seconds, hold the muscle as tight as you can without increasing your pain. 4. Relax the muscles slowly and completely. Repeat __________ times. Complete this exercise __________ times a day. Straight leg raises This exercise stretches the muscles in front of your thigh (quadriceps) and the muscles that move your hips (hip flexors). 1. Lie on your back with your left / right leg extended and your other knee bent. 2. Tense the muscles in the front of your left / right thigh. You should see your kneecap slide up or see increased dimpling just above the knee. Your thigh may even shake a bit. 3. Keep these muscles tight as you raise your leg 4-6 inches (10-15 cm) off the floor. Do not let your knee bend. 4. Hold this position for __________ seconds. 5. Keep these muscles tense as you lower your leg. 6. Relax your muscles slowly and completely after each repetition. Repeat __________  times. Complete this exercise __________ times a day. Hamstring, isometric 1. Lie on your back on a firm surface. 2. Bend your left / right knee about __________ degrees. 3. Dig your left / right heel into the surface as if you are trying to pull it toward your buttocks. Tighten the muscles in the back of your thighs (hamstring) to "dig" as hard as you can without increasing any pain. 4. Hold this position for __________ seconds. 5. Release the tension gradually and allow your muscles to relax completely for __________ seconds after each repetition. Repeat __________ times. Complete this exercise __________ times a day. Hamstring curls If told by your health care provider, do this exercise while wearing ankle weights. Begin with __________ lb weights. Then increase the weight by 1 lb (0.5 kg) increments. Do not wear ankle weights that are more than __________ lb. 1. Lie on your abdomen with your legs straight. 2. Bend your left / right knee as far as you can without feeling pain. Keep your hips flat against the floor. 3. Hold this position for __________ seconds. 4. Slowly lower your leg to the starting position. Repeat __________ times. Complete this exercise __________ times a day. Squats This exercise strengthens the muscles in front of your thigh and knee (quadriceps). 1. Stand in front of a table, with your feet and knees pointing straight ahead. You may rest your hands on the table for balance but not for support. 2. Slowly bend your knees and lower your hips like you are going to sit in a chair. ? Keep your weight over your heels, not over your toes. ? Keep your lower legs upright so they are parallel with the table legs. ? Do not let your hips go lower than your knees. ? Do not bend lower than told by your health care provider. ? If your knee pain increases, do not bend as low. 3. Hold the squat position for __________ seconds. 4. Slowly push with your legs to return to standing. Do  not use your hands to pull yourself to standing. Repeat __________ times. Complete this exercise __________ times a day. Wall slides This exercise strengthens the muscles in front of your thigh and knee (quadriceps). 1. Lean your back against a smooth wall or door, and walk your feet out 18-24 inches (46-61 cm) from it. 2. Place your feet hip-width apart. 3.  Slowly slide down the wall or door until your knees bend __________ degrees. Keep your knees over your heels, not over your toes. Keep your knees in line with your hips. 4. Hold this position for __________ seconds. Repeat __________ times. Complete this exercise __________ times a day. Straight leg raises This exercise strengthens the muscles that rotate the leg at the hip and move it away from your body (hip abductors). 1. Lie on your side with your left / right leg in the top position. Lie so your head, shoulder, knee, and hip line up. You may bend your bottom knee to help you keep your balance. 2. Roll your hips slightly forward so your hips are stacked directly over each other and your left / right knee is facing forward. 3. Leading with your heel, lift your top leg 4-6 inches (10-15 cm). You should feel the muscles in your outer hip lifting. ? Do not let your foot drift forward. ? Do not let your knee roll toward the ceiling. 4. Hold this position for __________ seconds. 5. Slowly return your leg to the starting position. 6. Let your muscles relax completely after each repetition. Repeat __________ times. Complete this exercise __________ times a day. Straight leg raises This exercise stretches the muscles that move your hips away from the front of the pelvis (hip extensors). 1. Lie on your abdomen on a firm surface. You can put a pillow under your hips if that is more comfortable. 2. Tense the muscles in your buttocks and lift your left / right leg about 4-6 inches (10-15 cm). Keep your knee straight as you lift your leg. 3. Hold  this position for __________ seconds. 4. Slowly lower your leg to the starting position. 5. Let your leg relax completely after each repetition. Repeat __________ times. Complete this exercise __________ times a day. This information is not intended to replace advice given to you by your health care provider. Make sure you discuss any questions you have with your health care provider. Document Released: 02/14/2005 Document Revised: 01/21/2018 Document Reviewed: 01/21/2018 Elsevier Patient Education  2020 [employer].

## 2019-04-14 ENCOUNTER — Encounter: Payer: Self-pay | Admitting: Orthopedic Surgery

## 2019-04-15 ENCOUNTER — Encounter: Payer: Self-pay | Admitting: Family

## 2019-04-15 ENCOUNTER — Other Ambulatory Visit: Payer: Self-pay

## 2019-04-15 ENCOUNTER — Ambulatory Visit (INDEPENDENT_AMBULATORY_CARE_PROVIDER_SITE_OTHER): Payer: No Typology Code available for payment source | Admitting: Family

## 2019-04-15 VITALS — Ht 65.0 in | Wt 236.0 lb

## 2019-04-15 DIAGNOSIS — W19XXXA Unspecified fall, initial encounter: Secondary | ICD-10-CM | POA: Diagnosis not present

## 2019-04-15 DIAGNOSIS — L03116 Cellulitis of left lower limb: Secondary | ICD-10-CM | POA: Diagnosis not present

## 2019-04-15 DIAGNOSIS — R600 Localized edema: Secondary | ICD-10-CM | POA: Diagnosis not present

## 2019-04-15 MED ORDER — CEPHALEXIN 500 MG PO CAPS: 500.0000 mg | ORAL_CAPSULE | Freq: Three times a day (TID) | ORAL | 0 refills | Status: DC
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
## 2019-05-05 ENCOUNTER — Other Ambulatory Visit: Payer: Self-pay | Admitting: Orthopedic Surgery

## 2019-05-05 DIAGNOSIS — M25551 Pain in right hip: Secondary | ICD-10-CM

## 2019-05-06 ENCOUNTER — Ambulatory Visit
Admission: RE | Admit: 2019-05-06 | Discharge: 2019-05-06 | Disposition: A | Discharge status: Home / Self Care | Payer: No Typology Code available for payment source | Source: Ambulatory Visit | Attending: Orthopedic Surgery | Admitting: Orthopedic Surgery

## 2019-05-06 DIAGNOSIS — M25551 Pain in right hip: Secondary | ICD-10-CM

## 2019-06-03 ENCOUNTER — Other Ambulatory Visit: Payer: Self-pay | Admitting: Nurse Practitioner

## 2019-06-03 DIAGNOSIS — G2581 Restless legs syndrome: Secondary | ICD-10-CM

## 2019-06-03 MED ORDER — PRAMIPEXOLE DIHYDROCHLORIDE 1.5 MG PO TABS: 1.5000 mg | ORAL_TABLET | Freq: Three times a day (TID) | ORAL | 0 refills | Status: DC

## 2019-07-06 ENCOUNTER — Encounter: Payer: Self-pay | Admitting: Certified Nurse Midwife

## 2019-07-28 MED ORDER — BISACODYL 10 MG RE SUPP
10.00 | RECTAL | Status: DC
Start: ? — End: 2019-07-28

## 2019-07-28 MED ORDER — OXYCODONE HCL 5 MG PO TABS
5.00 | ORAL_TABLET | ORAL | Status: DC
Start: ? — End: 2019-07-28

## 2019-07-28 MED ORDER — ENOXAPARIN SODIUM 40 MG/0.4ML ~~LOC~~ SOLN
40.00 | SUBCUTANEOUS | Status: DC
Start: 2019-07-29 — End: 2019-07-28

## 2019-07-28 MED ORDER — SODIUM CHLORIDE FLUSH 0.9 % IV SOLN
10.00 | INTRAVENOUS | Status: DC
Start: 2019-07-28 — End: 2019-07-28

## 2019-07-28 MED ORDER — SODIUM CHLORIDE FLUSH 0.9 % IV SOLN
10.00 | INTRAVENOUS | Status: DC
Start: ? — End: 2019-07-28

## 2019-07-28 MED ORDER — CELECOXIB 200 MG PO CAPS
200.00 | ORAL_CAPSULE | ORAL | Status: DC
Start: 2019-07-28 — End: 2019-07-28

## 2019-07-28 MED ORDER — GENERIC EXTERNAL MEDICATION
1.50 | Status: DC
Start: 2019-07-28 — End: 2019-07-28

## 2019-07-28 MED ORDER — LACTATED RINGERS IV SOLN
75.00 | INTRAVENOUS | Status: DC
Start: ? — End: 2019-07-28

## 2019-07-28 MED ORDER — SENNOSIDES-DOCUSATE SODIUM 8.6-50 MG PO TABS
2.00 | ORAL_TABLET | ORAL | Status: DC
Start: 2019-07-28 — End: 2019-07-28

## 2019-07-28 MED ORDER — ACETAMINOPHEN 500 MG PO TABS
1000.00 | ORAL_TABLET | ORAL | Status: DC
Start: 2019-07-28 — End: 2019-07-28

## 2019-07-28 MED ORDER — ENEMA 7-19 GM/118ML RE ENEM
1.00 | ENEMA | RECTAL | Status: DC
Start: ? — End: 2019-07-28

## 2019-08-14 ENCOUNTER — Encounter: Payer: Self-pay | Admitting: Nurse Practitioner

## 2019-09-01 ENCOUNTER — Ambulatory Visit (INDEPENDENT_AMBULATORY_CARE_PROVIDER_SITE_OTHER): Payer: No Typology Code available for payment source | Admitting: Orthopedic Surgery

## 2019-09-01 ENCOUNTER — Encounter: Payer: Self-pay | Admitting: Family

## 2019-09-01 ENCOUNTER — Ambulatory Visit: Payer: Self-pay

## 2019-09-01 ENCOUNTER — Other Ambulatory Visit: Payer: Self-pay

## 2019-09-01 VITALS — Ht 65.0 in | Wt 236.0 lb

## 2019-09-01 DIAGNOSIS — M25571 Pain in right ankle and joints of right foot: Secondary | ICD-10-CM | POA: Diagnosis not present

## 2019-09-01 MED ORDER — METHYLPREDNISOLONE ACETATE 40 MG/ML IJ SUSP
40.0000 mg | INTRAMUSCULAR | Status: AC | PRN
  Administered 2019-09-01: 40 mg via INTRA_ARTICULAR

## 2019-09-01 MED ORDER — LIDOCAINE HCL 1 % IJ SOLN
2.0000 mL | INTRAMUSCULAR | Status: AC | PRN
  Administered 2019-09-01: 2 mL
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
## 2019-09-02 ENCOUNTER — Encounter: Payer: Self-pay | Admitting: Orthopedic Surgery

## 2019-09-22 ENCOUNTER — Other Ambulatory Visit: Payer: Self-pay | Admitting: Nurse Practitioner

## 2019-09-22 ENCOUNTER — Ambulatory Visit: Payer: No Typology Code available for payment source | Admitting: Orthopedic Surgery

## 2019-09-22 DIAGNOSIS — G2581 Restless legs syndrome: Secondary | ICD-10-CM

## 2019-09-22 NOTE — Telephone Encounter (Signed)
OV 10/10/18 RTC 1 yr

## 2019-09-24 ENCOUNTER — Encounter: Payer: Self-pay | Admitting: Orthopedic Surgery

## 2019-09-24 ENCOUNTER — Other Ambulatory Visit: Payer: Self-pay

## 2019-09-24 ENCOUNTER — Ambulatory Visit (INDEPENDENT_AMBULATORY_CARE_PROVIDER_SITE_OTHER): Payer: No Typology Code available for payment source | Admitting: Physician Assistant

## 2019-09-24 VITALS — Ht 65.0 in | Wt 236.0 lb

## 2019-09-24 DIAGNOSIS — M25871 Other specified joint disorders, right ankle and foot: Secondary | ICD-10-CM | POA: Diagnosis not present
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
## 2019-10-01 ENCOUNTER — Encounter: Payer: Self-pay | Admitting: Orthopedic Surgery

## 2019-10-06 ENCOUNTER — Encounter: Payer: Self-pay | Admitting: Orthopedic Surgery

## 2019-10-20 ENCOUNTER — Encounter (HOSPITAL_BASED_OUTPATIENT_CLINIC_OR_DEPARTMENT_OTHER): Payer: Self-pay

## 2019-10-20 ENCOUNTER — Ambulatory Visit (HOSPITAL_BASED_OUTPATIENT_CLINIC_OR_DEPARTMENT_OTHER): Admit: 2019-10-20 | Payer: No Typology Code available for payment source | Admitting: Orthopedic Surgery

## 2019-10-20 SURGERY — ARTHROSCOPY, ANKLE
Anesthesia: Choice | Site: Ankle | Laterality: Right

## 2019-11-05 ENCOUNTER — Encounter: Payer: Self-pay | Admitting: Nurse Practitioner

## 2019-11-05 DIAGNOSIS — G2581 Restless legs syndrome: Secondary | ICD-10-CM

## 2019-11-05 MED ORDER — PRAMIPEXOLE DIHYDROCHLORIDE 1.5 MG PO TABS: 1.5000 mg | ORAL_TABLET | Freq: Three times a day (TID) | ORAL | 0 refills | Status: DC

## 2019-11-25 ENCOUNTER — Ambulatory Visit (INDEPENDENT_AMBULATORY_CARE_PROVIDER_SITE_OTHER): Payer: No Typology Code available for payment source | Admitting: Nurse Practitioner

## 2019-11-25 ENCOUNTER — Other Ambulatory Visit: Payer: Self-pay

## 2019-11-25 ENCOUNTER — Encounter: Payer: Self-pay | Admitting: Nurse Practitioner

## 2019-11-25 ENCOUNTER — Ambulatory Visit: Payer: No Typology Code available for payment source | Admitting: Nurse Practitioner

## 2019-11-25 VITALS — BP 120/77 | HR 58 | Temp 98.0°F | Resp 20 | Ht 65.0 in | Wt 241.0 lb

## 2019-11-25 DIAGNOSIS — M25571 Pain in right ankle and joints of right foot: Secondary | ICD-10-CM

## 2019-11-25 DIAGNOSIS — F339 Major depressive disorder, recurrent, unspecified: Secondary | ICD-10-CM

## 2019-11-25 MED ORDER — DICLOFENAC SODIUM 75 MG PO TBEC: 75.0000 mg | DELAYED_RELEASE_TABLET | Freq: Two times a day (BID) | ORAL | 1 refills | Status: DC

## 2019-11-25 MED ORDER — VIIBRYD 20 MG PO TABS: 1.0000 | ORAL_TABLET | Freq: Every day | ORAL | 5 refills | Status: DC
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
## 2019-11-25 NOTE — Patient Instructions (Signed)

## 2019-12-01 ENCOUNTER — Encounter: Payer: Self-pay | Admitting: Nurse Practitioner

## 2019-12-01 ENCOUNTER — Other Ambulatory Visit: Payer: Self-pay

## 2019-12-01 DIAGNOSIS — R928 Other abnormal and inconclusive findings on diagnostic imaging of breast: Secondary | ICD-10-CM

## 2019-12-01 DIAGNOSIS — N6310 Unspecified lump in the right breast, unspecified quadrant: Secondary | ICD-10-CM

## 2019-12-18 ENCOUNTER — Ambulatory Visit: Payer: No Typology Code available for payment source | Admitting: Nurse Practitioner

## 2019-12-22 ENCOUNTER — Encounter (HOSPITAL_COMMUNITY): Payer: No Typology Code available for payment source

## 2019-12-22 ENCOUNTER — Ambulatory Visit (HOSPITAL_COMMUNITY): Payer: No Typology Code available for payment source

## 2019-12-23 ENCOUNTER — Ambulatory Visit: Payer: Self-pay | Admitting: Nurse Practitioner

## 2019-12-30 ENCOUNTER — Encounter: Payer: Self-pay | Admitting: Nurse Practitioner

## 2020-01-11 ENCOUNTER — Other Ambulatory Visit: Payer: Self-pay | Admitting: Nurse Practitioner

## 2020-01-11 DIAGNOSIS — G2581 Restless legs syndrome: Secondary | ICD-10-CM

## 2020-01-12 ENCOUNTER — Ambulatory Visit (HOSPITAL_COMMUNITY)
Admission: RE | Admit: 2020-01-12 | Discharge: 2020-01-12 | Disposition: A | Discharge status: Home / Self Care | Payer: No Typology Code available for payment source | Source: Ambulatory Visit | Attending: Nurse Practitioner | Admitting: Nurse Practitioner

## 2020-01-12 ENCOUNTER — Other Ambulatory Visit: Payer: Self-pay

## 2020-01-12 DIAGNOSIS — N6310 Unspecified lump in the right breast, unspecified quadrant: Secondary | ICD-10-CM

## 2020-01-12 DIAGNOSIS — R928 Other abnormal and inconclusive findings on diagnostic imaging of breast: Secondary | ICD-10-CM | POA: Insufficient documentation

## 2020-02-18 ENCOUNTER — Other Ambulatory Visit: Payer: Self-pay | Admitting: Nurse Practitioner

## 2020-02-18 DIAGNOSIS — G2581 Restless legs syndrome: Secondary | ICD-10-CM

## 2020-02-18 MED ORDER — PRAMIPEXOLE DIHYDROCHLORIDE 1.5 MG PO TABS: 1.5000 mg | ORAL_TABLET | Freq: Three times a day (TID) | ORAL | 0 refills | Status: DC

## 2020-02-18 NOTE — Telephone Encounter (Signed)
Are you okay with adding more refills?

## 2020-02-18 NOTE — Progress Notes (Signed)
Refill of mirapex was sent to pharmacy

## 2020-08-04 ENCOUNTER — Other Ambulatory Visit: Payer: Self-pay | Admitting: Nurse Practitioner

## 2020-08-04 DIAGNOSIS — G2581 Restless legs syndrome: Secondary | ICD-10-CM

## 2020-09-08 DIAGNOSIS — F32A Depression, unspecified: Secondary | ICD-10-CM | POA: Insufficient documentation

## 2020-10-11 ENCOUNTER — Ambulatory Visit: Payer: No Typology Code available for payment source | Admitting: Dermatology

## 2020-12-12 ENCOUNTER — Encounter: Payer: No Typology Code available for payment source | Admitting: Nurse Practitioner

## 2020-12-13 ENCOUNTER — Ambulatory Visit (INDEPENDENT_AMBULATORY_CARE_PROVIDER_SITE_OTHER): Payer: No Typology Code available for payment source | Admitting: Nurse Practitioner

## 2020-12-13 ENCOUNTER — Encounter: Payer: Self-pay | Admitting: Nurse Practitioner

## 2020-12-13 ENCOUNTER — Other Ambulatory Visit (HOSPITAL_COMMUNITY)
Admission: RE | Admit: 2020-12-13 | Discharge: 2020-12-13 | Disposition: A | Discharge status: Home / Self Care | Payer: No Typology Code available for payment source | Source: Ambulatory Visit | Attending: Nurse Practitioner | Admitting: Nurse Practitioner

## 2020-12-13 ENCOUNTER — Other Ambulatory Visit: Payer: Self-pay

## 2020-12-13 VITALS — BP 134/80 | HR 63 | Temp 97.4°F | Resp 20 | Ht 65.0 in | Wt 241.0 lb

## 2020-12-13 DIAGNOSIS — Z0000 Encounter for general adult medical examination without abnormal findings: Secondary | ICD-10-CM | POA: Diagnosis present

## 2020-12-13 DIAGNOSIS — K439 Ventral hernia without obstruction or gangrene: Secondary | ICD-10-CM

## 2020-12-13 DIAGNOSIS — G2581 Restless legs syndrome: Secondary | ICD-10-CM

## 2020-12-13 DIAGNOSIS — R600 Localized edema: Secondary | ICD-10-CM | POA: Diagnosis not present

## 2020-12-13 DIAGNOSIS — Z0001 Encounter for general adult medical examination with abnormal findings: Secondary | ICD-10-CM | POA: Diagnosis not present

## 2020-12-13 DIAGNOSIS — G43109 Migraine with aura, not intractable, without status migrainosus: Secondary | ICD-10-CM | POA: Diagnosis not present

## 2020-12-13 DIAGNOSIS — Z9889 Other specified postprocedural states: Secondary | ICD-10-CM

## 2020-12-13 LAB — URINALYSIS, COMPLETE
Bilirubin, UA: NEGATIVE
Glucose, UA: NEGATIVE
Ketones, UA: NEGATIVE
Leukocytes,UA: NEGATIVE
Nitrite, UA: NEGATIVE
Protein,UA: NEGATIVE
RBC, UA: NEGATIVE
Specific Gravity, UA: 1.02 (ref 1.005–1.030)
Urobilinogen, Ur: 0.2 mg/dL (ref 0.2–1.0)
pH, UA: 7.5 (ref 5.0–7.5)

## 2020-12-13 LAB — MICROSCOPIC EXAMINATION
RBC, Urine: NONE SEEN /hpf (ref 0–2)
Renal Epithel, UA: NONE SEEN /hpf
WBC, UA: NONE SEEN /hpf (ref 0–5)

## 2020-12-13 MED ORDER — FUROSEMIDE 20 MG PO TABS: 20.0000 mg | ORAL_TABLET | Freq: Every day | ORAL | 1 refills | Status: DC

## 2020-12-13 MED ORDER — PRAMIPEXOLE DIHYDROCHLORIDE 1.5 MG PO TABS: 1.5000 mg | ORAL_TABLET | Freq: Three times a day (TID) | ORAL | 1 refills | Status: DC
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
## 2020-12-13 NOTE — Telephone Encounter (Signed)
That is just how results come up , but it is normal to have some bacteria in urine.

## 2020-12-14 LAB — CBC WITH DIFFERENTIAL/PLATELET
Basophils Absolute: 0 10*3/uL (ref 0.0–0.2)
Basos: 0 %
EOS (ABSOLUTE): 0.1 10*3/uL (ref 0.0–0.4)
Eos: 2 %
Hematocrit: 41.8 % (ref 34.0–46.6)
Hemoglobin: 14 g/dL (ref 11.1–15.9)
Immature Grans (Abs): 0 10*3/uL (ref 0.0–0.1)
Immature Granulocytes: 0 %
Lymphocytes Absolute: 2.2 10*3/uL (ref 0.7–3.1)
Lymphs: 49 %
MCH: 28.3 pg (ref 26.6–33.0)
MCHC: 33.5 g/dL (ref 31.5–35.7)
MCV: 85 fL (ref 79–97)
Monocytes Absolute: 0.3 10*3/uL (ref 0.1–0.9)
Monocytes: 6 %
Neutrophils Absolute: 2 10*3/uL (ref 1.4–7.0)
Neutrophils: 43 %
Platelets: 178 10*3/uL (ref 150–450)
RBC: 4.94 x10E6/uL (ref 3.77–5.28)
RDW: 12.6 % (ref 11.7–15.4)
WBC: 4.7 10*3/uL (ref 3.4–10.8)

## 2020-12-14 LAB — CMP14+EGFR
ALT: 12 IU/L (ref 0–32)
AST: 11 IU/L (ref 0–40)
Albumin/Globulin Ratio: 2.2 (ref 1.2–2.2)
Albumin: 4.2 g/dL (ref 3.8–4.8)
Alkaline Phosphatase: 68 IU/L (ref 44–121)
BUN/Creatinine Ratio: 9 (ref 9–23)
BUN: 7 mg/dL (ref 6–24)
Bilirubin Total: 0.5 mg/dL (ref 0.0–1.2)
CO2: 23 mmol/L (ref 20–29)
Calcium: 9 mg/dL (ref 8.7–10.2)
Chloride: 104 mmol/L (ref 96–106)
Creatinine, Ser: 0.77 mg/dL (ref 0.57–1.00)
Globulin, Total: 1.9 g/dL (ref 1.5–4.5)
Glucose: 72 mg/dL (ref 65–99)
Potassium: 4.2 mmol/L (ref 3.5–5.2)
Sodium: 141 mmol/L (ref 134–144)
Total Protein: 6.1 g/dL (ref 6.0–8.5)
eGFR: 95 mL/min/{1.73_m2} (ref >59)

## 2020-12-14 LAB — CYTOLOGY - PAP
Adequacy: ABSENT
Diagnosis: NEGATIVE

## 2020-12-14 LAB — LIPID PANEL
Chol/HDL Ratio: 2.7 ratio (ref 0.0–4.4)
Cholesterol, Total: 203 mg/dL — ABNORMAL HIGH (ref 100–199)
HDL: 75 mg/dL (ref >39)
LDL Chol Calc (NIH): 114 mg/dL — ABNORMAL HIGH (ref 0–99)
Triglycerides: 78 mg/dL (ref 0–149)
VLDL Cholesterol Cal: 14 mg/dL (ref 5–40)

## 2020-12-14 LAB — THYROID PANEL WITH TSH
Free Thyroxine Index: 2.4 (ref 1.2–4.9)
T3 Uptake Ratio: 28 % (ref 24–39)
T4, Total: 8.6 ug/dL (ref 4.5–12.0)
TSH: 1.87 u[IU]/mL (ref 0.450–4.500)

## 2020-12-28 ENCOUNTER — Encounter: Payer: Self-pay | Admitting: Internal Medicine

## 2021-01-18 ENCOUNTER — Other Ambulatory Visit (HOSPITAL_COMMUNITY): Payer: Self-pay | Admitting: Nurse Practitioner

## 2021-01-18 ENCOUNTER — Other Ambulatory Visit: Payer: Self-pay | Admitting: Nurse Practitioner

## 2021-01-18 DIAGNOSIS — M2550 Pain in unspecified joint: Secondary | ICD-10-CM

## 2021-01-18 DIAGNOSIS — Z1231 Encounter for screening mammogram for malignant neoplasm of breast: Secondary | ICD-10-CM

## 2021-01-18 NOTE — Progress Notes (Unsigned)
Referral made to rheumatology

## 2021-01-23 ENCOUNTER — Ambulatory Visit (AMBULATORY_SURGERY_CENTER): Payer: No Typology Code available for payment source | Admitting: *Deleted

## 2021-01-23 ENCOUNTER — Other Ambulatory Visit: Payer: Self-pay

## 2021-01-23 VITALS — Ht 64.0 in | Wt 235.0 lb

## 2021-01-23 DIAGNOSIS — Z1211 Encounter for screening for malignant neoplasm of colon: Secondary | ICD-10-CM

## 2021-01-23 MED ORDER — PEG-KCL-NACL-NASULF-NA ASC-C 100 G PO SOLR: 1.0000 | Freq: Once | ORAL | 0 refills | Status: AC

## 2021-01-23 NOTE — Progress Notes (Signed)
Virtual pre-visit completed over the telephone. Instructions forwarded through [place]. Patient verified she no longer takes Phentermine.  No egg or soy allergy known to patient  No issues known to pt with past sedation with any surgeries or procedures Patient denies ever being told they had issues or difficulty with intubation  No FH of Malignant Hyperthermia Pt is not on diet pills Pt is not on  home 02  Pt is not on blood thinners  Pt denies issues with constipation  No A fib or A flutter  Pt is fully vaccinated  for Covid   Discussed with pt there will be an out-of-pocket cost for prep and that varies from $0 to 70 +  dollars - pt verbalized understanding   Due to the COVID-19 pandemic we are asking patients to follow certain guidelines in [facility] and the [place]   Pt aware of COVID protocols and [facility] guidelines

## 2021-01-24 ENCOUNTER — Encounter: Payer: Self-pay | Admitting: Internal Medicine

## 2021-01-26 ENCOUNTER — Ambulatory Visit (HOSPITAL_COMMUNITY)
Admission: RE | Admit: 2021-01-26 | Discharge: 2021-01-26 | Disposition: A | Discharge status: Home / Self Care | Payer: No Typology Code available for payment source | Source: Ambulatory Visit | Attending: Nurse Practitioner | Admitting: Nurse Practitioner

## 2021-01-26 ENCOUNTER — Other Ambulatory Visit: Payer: Self-pay

## 2021-01-26 DIAGNOSIS — Z1231 Encounter for screening mammogram for malignant neoplasm of breast: Secondary | ICD-10-CM | POA: Insufficient documentation

## 2021-01-27 ENCOUNTER — Encounter (INDEPENDENT_AMBULATORY_CARE_PROVIDER_SITE_OTHER): Payer: Self-pay

## 2021-02-06 ENCOUNTER — Ambulatory Visit (AMBULATORY_SURGERY_CENTER): Payer: No Typology Code available for payment source | Admitting: Internal Medicine

## 2021-02-06 ENCOUNTER — Other Ambulatory Visit: Payer: Self-pay

## 2021-02-06 ENCOUNTER — Encounter: Payer: Self-pay | Admitting: Internal Medicine

## 2021-02-06 VITALS — BP 115/71 | HR 65 | Temp 97.8°F | Resp 13 | Ht 64.0 in | Wt 235.0 lb

## 2021-02-06 DIAGNOSIS — Z1211 Encounter for screening for malignant neoplasm of colon: Secondary | ICD-10-CM

## 2021-02-06 DIAGNOSIS — D122 Benign neoplasm of ascending colon: Secondary | ICD-10-CM | POA: Diagnosis not present

## 2021-02-06 DIAGNOSIS — Z8371 Family history of colonic polyps: Secondary | ICD-10-CM

## 2021-02-06 MED ORDER — SODIUM CHLORIDE 0.9 % IV SOLN: 500.0000 mL | Freq: Once | INTRAVENOUS | Status: DC

## 2021-02-06 NOTE — Progress Notes (Signed)
VS completed by DT.  Pt's states no medical or surgical changes since previsit or office visit.  

## 2021-02-06 NOTE — Patient Instructions (Signed)
YOU HAD AN ENDOSCOPIC PROCEDURE TODAY AT [facility]:   Refer to the procedure report that was given to you for any specific questions about what was found during the examination.  If the procedure report does not answer your questions, please call your gastroenterologist to clarify.  If you requested that your care partner not be given the details of your procedure findings, then the procedure report has been included in a sealed envelope for you to review at your convenience later.  **Handout given on polyps, diverticulosis and hemorrhoids**  YOU SHOULD EXPECT: Some feelings of bloating in the abdomen. Passage of more gas than usual.  Walking can help get rid of the air that was put into your GI tract during the procedure and reduce the bloating. If you had a lower endoscopy (such as a colonoscopy or flexible sigmoidoscopy) you may notice spotting of blood in your stool or on the toilet paper. If you underwent a bowel prep for your procedure, you may not have a normal bowel movement for a few days.  Please Note:  You might notice some irritation and congestion in your nose or some drainage.  This is from the oxygen used during your procedure.  There is no need for concern and it should clear up in a day or so.  SYMPTOMS TO REPORT IMMEDIATELY:  Following lower endoscopy (colonoscopy or flexible sigmoidoscopy):  Excessive amounts of blood in the stool  Significant tenderness or worsening of abdominal pains  Swelling of the abdomen that is new, acute  Fever of 100F or higher  For urgent or emergent issues, a gastroenterologist can be reached at any hour by calling [phone]. Do not use MyChart messaging for urgent concerns.    DIET:  We do recommend a small meal at first, but then you may proceed to your regular diet.  Drink plenty of fluids but you should avoid alcoholic beverages for 24 hours.  ACTIVITY:  You should plan to take it easy for the rest of today and you  should NOT DRIVE or use heavy machinery until tomorrow (because of the sedation medicines used during the test).    FOLLOW UP: Our staff will call the number listed on your records 48-72 hours following your procedure to check on you and address any questions or concerns that you may have regarding the information given to you following your procedure. If we do not reach you, we will leave a message.  We will attempt to reach you two times.  During this call, we will ask if you have developed any symptoms of COVID 19. If you develop any symptoms (ie: fever, flu-like symptoms, shortness of breath, cough etc.) before then, please call [phone].  If you test positive for Covid 19 in the 2 weeks post procedure, please call and report this information to [place].    If any biopsies were taken you will be contacted by phone or by letter within the next 1-3 weeks.  Please call us at [phone] if you have not heard about the biopsies in 3 weeks.    SIGNATURES/CONFIDENTIALITY: You and/or your care partner have signed paperwork which will be entered into your electronic medical record.  These signatures attest to the fact that that the information above on your After Visit Summary has been reviewed and is understood.  Full responsibility of the confidentiality of this discharge information lies with you and/or your care-partner.
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
## 2021-02-06 NOTE — Progress Notes (Signed)
Called to room to assist during endoscopic procedure.  Patient ID and intended procedure confirmed with present staff. Received instructions for my participation in the procedure from the performing physician.  

## 2021-02-06 NOTE — Op Note (Signed)
[name]: Margaret Cruz Procedure Date: 02/06/2021 8:26 AM MRN: [id number] Endoscopist: Jerene Bears , MD Age: 50 Referring MD:  Date of Birth: January 30, 1971 Gender: Female Account #: [id number] Procedure:                Colonoscopy Indications:              Colon cancer screening in patient with 1st-degree                            relative having advanced adenoma of the colon                            before age 43 Medicines:                Propofol per Anesthesia Procedure:                Pre-Anesthesia Assessment:                           - Prior to the procedure, a History and Physical                            was performed, and patient medications and                            allergies were reviewed. The patient's tolerance of                            previous anesthesia was also reviewed. The risks                            and benefits of the procedure and the sedation                            options and risks were discussed with the patient.                            All questions were answered, and informed consent                            was obtained. Prior Anticoagulants: The patient has                            taken no previous anticoagulant or antiplatelet                            agents. ASA Grade Assessment: II - A patient with                            mild systemic disease. After reviewing the risks                            and benefits, the patient was deemed in  satisfactory condition to undergo the procedure.                           After obtaining informed consent, the colonoscope                            was passed under direct vision. Throughout the                            procedure, the patient's blood pressure, pulse, and                            oxygen saturations were monitored continuously. The                            Olympus PCF-H190DL [id number]) Colonoscope was                             introduced through the anus and advanced to the                            cecum, identified by appendiceal orifice and                            ileocecal valve. The colonoscopy was performed                            without difficulty. The patient tolerated the                            procedure well. The quality of the bowel                            preparation was good. The ileocecal valve,                            appendiceal orifice, and rectum were photographed. Scope In: 8:33:29 AM Scope Out: 8:53:20 AM Scope Withdrawal Time: 0 hours 17 minutes 12 seconds  Total Procedure Duration: 0 hours 19 minutes 51 seconds  Findings:                 Skin tags were found on perianal exam.                           A 8 mm polyp was found in the ascending colon. The                            polyp was sessile. The polyp was removed with a                            cold snare. Resection and retrieval were complete.                           Scattered small-mouthed diverticula were found in  the sigmoid colon and descending colon.                           External and internal hemorrhoids were found during                            retroflexion. The hemorrhoids were small. Complications:            No immediate complications. Estimated Blood Loss:     Estimated blood loss was minimal. Impression:               - One 8 mm polyp in the ascending colon, removed                            with a cold snare. Resected and retrieved.                           - Diverticulosis in the sigmoid colon and in the                            descending colon.                           - Small external and internal hemorrhoids with                            small perianal skin tags. Recommendation:           - Patient has a contact number available for                            emergencies. The signs and symptoms of potential                             delayed complications were discussed with the                            patient. Return to normal activities tomorrow.                            Written discharge instructions were provided to the                            patient.                           - Resume previous diet.                           - Continue present medications.                           - Await pathology results.                           - Repeat colonoscopy is recommended for  surveillance. The colonoscopy date will be                            determined after pathology results from today's                            exam become available for review. Jerene Bears, MD 02/06/2021 8:56:08 AM This report has been signed electronically.

## 2021-02-06 NOTE — Progress Notes (Signed)
Report given to PACU, vss 

## 2021-02-08 ENCOUNTER — Telehealth: Payer: Self-pay

## 2021-02-08 NOTE — Telephone Encounter (Signed)
  Follow up Call-  Call back number 02/06/2021  Post procedure Call Back phone  # [phone]  Permission to leave phone message Yes  Some recent data might be hidden     Patient questions:  Do you have a fever, pain , or abdominal swelling? No. Pain Score  0 *  Have you tolerated food without any problems? Yes.    Have you been able to return to your normal activities? Yes.    Do you have any questions about your discharge instructions: Diet   No. Medications  No. Follow up visit  No.  Do you have questions or concerns about your Care? No.  Actions: * If pain score is 4 or above: No action needed, pain <4.  Have you developed a fever since your procedure? no  2.   Have you had an respiratory symptoms (SOB or cough) since your procedure? no  3.   Have you tested positive for COVID 19 since your procedure no  4.   Have you had any family members/close contacts diagnosed with the COVID 19 since your procedure?  no   If yes to any of these questions please route to Joylene John, RN and Joella Prince, RN

## 2021-02-14 ENCOUNTER — Encounter: Payer: Self-pay | Admitting: Internal Medicine

## 2021-02-24 ENCOUNTER — Encounter: Payer: Self-pay | Admitting: Internal Medicine

## 2021-02-24 ENCOUNTER — Other Ambulatory Visit: Payer: Self-pay

## 2021-02-24 ENCOUNTER — Ambulatory Visit (INDEPENDENT_AMBULATORY_CARE_PROVIDER_SITE_OTHER): Payer: No Typology Code available for payment source | Admitting: Internal Medicine

## 2021-02-24 VITALS — BP 123/77 | HR 69 | Resp 15 | Ht 64.0 in | Wt 242.8 lb

## 2021-02-24 DIAGNOSIS — M797 Fibromyalgia: Secondary | ICD-10-CM | POA: Diagnosis not present

## 2021-02-24 DIAGNOSIS — Z9889 Other specified postprocedural states: Secondary | ICD-10-CM

## 2021-02-24 DIAGNOSIS — K582 Mixed irritable bowel syndrome: Secondary | ICD-10-CM

## 2021-02-24 DIAGNOSIS — R200 Anesthesia of skin: Secondary | ICD-10-CM | POA: Insufficient documentation

## 2021-02-24 DIAGNOSIS — G43109 Migraine with aura, not intractable, without status migrainosus: Secondary | ICD-10-CM | POA: Diagnosis not present

## 2021-02-24 DIAGNOSIS — Z8781 Personal history of (healed) traumatic fracture: Secondary | ICD-10-CM

## 2021-02-24 DIAGNOSIS — K589 Irritable bowel syndrome without diarrhea: Secondary | ICD-10-CM | POA: Insufficient documentation

## 2021-02-24 DIAGNOSIS — G4709 Other insomnia: Secondary | ICD-10-CM

## 2021-02-24 NOTE — Patient Instructions (Addendum)
I recommend checking out the [place] patient-centered guide for fibromyalgia and chronic pain management: https://[email or web address]-oconnell.[email or web address]/  For your hand numbness I do not currently see changes indicating carpal tunnel syndrome, if you notice worsening problems especially weakness and numbness a nerve conduction study would be the definitive test for this problem.  You also have osteoarthritis in multiple joints related to prior injury in the leg and milder generalized amount elsewhere.  Fibromyalgia Fibromyalgia is a pain disorders. This pain may be felt mainly in your muscles. Fibromyalgia: Has muscle pains and tenderness that come and go. Is often associated with fatigue and sleep problems. Has trigger points. Tends to be long-lasting (chronic), but is not life-threatening. What are the causes? The exact causes of these conditions are not known. What increases the risk? You are more likely to develop this condition if: You have a family history of the condition. You have certain triggers, such as: Spine disorders. An injury (trauma) or other physical stressors. Being under a lot of stress. Medical conditions such as osteoarthritis, rheumatoid arthritis, or lupus. What are the signs or symptoms? Fibromyalgia The main symptom of fibromyalgia is widespread pain and tenderness in your muscles. Pain is sometimes described as stabbing, shooting, or burning. You may also have: Tingling or numbness. Sleep problems and fatigue. Problems with attention and concentration (fibro fog). Other symptoms may include:  Bowel and bladder problems. Headaches. Visual problems. Problems with odors and noises. Depression or mood changes. Painful menstrual periods (dysmenorrhea). Dry skin or eyes. These symptoms can vary over time. How is this diagnosed? This condition may be diagnosed by your symptoms and medical history. You will also have a physical exam. In general: Fibromyalgia is  diagnosed if you have pain, fatigue, and other symptoms for more than 3 months, and symptoms cannot be explained by another condition. How is this treated? Treatment for these conditions depends on the type that you have. For fibromyalgia: Pain medicines, such as NSAIDs. Medicines for treating depression. Medicines for treating seizures. Medicines that relax the muscles. Treating these conditions often requires a team of health care providers. These may include: Your primary care provider. Physical therapist. Complementary health care providers, such as massage therapists or acupuncturists. Psychiatrist for cognitive behavioral therapy. Follow these instructions at home: Medicines Take over-the-counter and prescription medicines only as told by your health care provider. Do not drive or use heavy machinery while taking prescription pain medicine. If you are taking prescription pain medicine, take actions to prevent or treat constipation. Your health care provider may recommend that you: Drink enough fluid to keep your urine pale yellow. Eat foods that are high in fiber, such as fresh fruits and vegetables, whole grains, and beans. Limit foods that are high in fat and processed sugars, such as fried or sweet foods. Take an over-the-counter or prescription medicine for constipation. Lifestyle  Exercise as directed by your health care provider or physical therapist. Practice relaxation techniques to control your stress. You may want to try: Biofeedback. Visual imagery. Hypnosis. Muscle relaxation. Yoga. Meditation. Maintain a healthy lifestyle. This includes eating a healthy diet and getting enough sleep. Do not use any products that contain nicotine or tobacco, such as cigarettes and e-cigarettes. If you need help quitting, ask your health care provider. General instructions Talk to your health care provider about complementary treatments, such as acupuncture or massage. Consider  joining a support group with others who are diagnosed with this condition. Do not do activities that stress or strain your muscles. This  includes repetitive motions and heavy lifting. Keep all follow-up visits as told by your health care provider. This is important. Where to find more information National Fibromyalgia Association: www.fmaware.[place]: www.arthritis.org American Chronic Pain Association: www.theacpa.org Contact a health care provider if: You have new symptoms. Your symptoms get worse or your pain is severe. You have side effects from your medicines. You have trouble sleeping. Your condition is causing depression or anxiety. Summary Myofascial pain syndrome and fibromyalgia are pain disorders. Myofascial pain syndrome has tender points in the muscle that will cause pain when pressed (trigger points). Fibromyalgia also has muscle pains and tenderness that come and go, but this condition is often associated with fatigue and sleep disturbances. Fibromyalgia and myofascial pain syndrome are not the same but often occur together, causing pain and fatigue that make day-to-day activities difficult. Treatment for fibromyalgia includes taking medicines to relax the muscles and medicines for pain, depression, or seizures. Treatment for myofascial pain syndrome includes taking medicines for pain, cooling and stretching of muscles, and injecting medicines into trigger points. Follow your health care provider's instructions for taking medicines and maintaining a healthy lifestyle.
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
## 2021-03-20 ENCOUNTER — Encounter: Payer: Self-pay | Admitting: Nurse Practitioner

## 2021-03-20 ENCOUNTER — Ambulatory Visit (INDEPENDENT_AMBULATORY_CARE_PROVIDER_SITE_OTHER): Payer: No Typology Code available for payment source | Admitting: Nurse Practitioner

## 2021-03-20 DIAGNOSIS — J40 Bronchitis, not specified as acute or chronic: Secondary | ICD-10-CM

## 2021-03-20 MED ORDER — CEFDINIR 300 MG PO CAPS: 300.0000 mg | ORAL_CAPSULE | Freq: Two times a day (BID) | ORAL | 0 refills | Status: DC

## 2021-03-20 MED ORDER — PREDNISONE 20 MG PO TABS: 40.0000 mg | ORAL_TABLET | Freq: Every day | ORAL | 0 refills | Status: AC

## 2021-03-20 MED ORDER — PROMETHAZINE-DM 6.25-15 MG/5ML PO SYRP: 5.0000 mL | ORAL_SOLUTION | Freq: Four times a day (QID) | ORAL | 0 refills | Status: DC | PRN

## 2021-03-20 NOTE — Patient Instructions (Signed)
Acute Bronchitis, Adult ?Acute bronchitis is when air tubes in the lungs (bronchi) suddenly get swollen. The condition can make it hard for you to breathe. In adults, acute bronchitis usually goes away within 2 weeks. A cough caused by bronchitis may last up to 3 weeks. Smoking, allergies, and asthma can make the condition worse. ?What are the causes? ?Germs that cause cold and flu (viruses). The most common cause of this condition is the virus that causes the common cold. ?Bacteria. ?Substances that bother (irritate) the lungs, including: ?Smoke from cigarettes and other types of tobacco. ?Dust and pollen. ?Fumes from chemicals, gases, or burned fuel. ?Indoor or outdoor air pollution. ?What increases the risk? ?A weak body's defense system. This is also called the immune system. ?Any condition that affects your lungs and breathing, such as asthma. ?What are the signs or symptoms? ?A cough. ?Coughing up clear, yellow, or green mucus. ?Making high-pitched whistling sounds when you breathe, most often when you breathe out (wheezing). ?Runny or stuffy nose. ?Having too much mucus in your lungs (chest congestion). ?Shortness of breath. ?Body aches. ?A sore throat. ?How is this treated? ?Acute bronchitis may go away over time without treatment. Your doctor may tell you to: ?Drink more fluids. This will help thin your mucus so it is easier to cough up. ?Use a device that gets medicine into your lungs (inhaler). ?Use a vaporizer or a humidifier. These are machines that add water to the air. This helps with coughing and poor breathing. ?Take a medicine that thins mucus and helps clear it from your lungs. ?Take a medicine that prevents or stops coughing. ?It is not common to take an antibiotic medicine for this condition. ?Follow these instructions at home: ? ?Take over-the-counter and prescription medicines only as told by your doctor. ?Use an inhaler, vaporizer, or humidifier as told by your doctor. ?Take two teaspoons (10  mL) of honey at bedtime. This helps lessen your coughing at night. ?Drink enough fluid to keep your pee (urine) pale yellow. ?Do not smoke or use any products that contain nicotine or tobacco. If you need help quitting, ask your doctor. ?Get a lot of rest. ?Return to your normal activities when your doctor says that it is safe. ?Keep all follow-up visits. ?How is this prevented? ? ?Wash your hands often with soap and water for at least 20 seconds. If you cannot use soap and water, use hand sanitizer. ?Avoid contact with people who have cold symptoms. ?Try not to touch your mouth, nose, or eyes with your hands. ?Avoid breathing in smoke or chemical fumes. ?Make sure to get the flu shot every year. ?Contact a doctor if: ?Your symptoms do not get better in 2 weeks. ?You have trouble coughing up the mucus. ?Your cough keeps you awake at night. ?You have a fever. ?Get help right away if: ?You cough up blood. ?You have chest pain. ?You have very bad shortness of breath. ?You faint or keep feeling like you are going to faint. ?You have a very bad headache. ?Your fever or chills get worse. ?These symptoms may be an emergency. Get help right away. Call your local emergency services (911 in the [place] ?Do not wait to see if the symptoms will go away. ?Do not drive yourself to the hospital. ?Summary ?Acute bronchitis is when air tubes in the lungs (bronchi) suddenly get swollen. In adults, acute bronchitis usually goes away within 2 weeks. ?Drink more fluids. This will help thin your mucus so it is easier to   cough up. ?Take over-the-counter and prescription medicines only as told by your doctor. ?Contact a doctor if your symptoms do not improve after 2 weeks of treatment. ?This information is not intended to replace advice given to you by your health care provider. Make sure you discuss any questions you have with your health care provider. ?Document Revised: 08/03/2020 Document Reviewed: 08/03/2020 ?Elsevier Patient Education  ? 2022 [employer] ? ?

## 2021-03-20 NOTE — Progress Notes (Signed)
Virtual Visit  Note Due to COVID-19 pandemic this visit was conducted virtually. This visit type was conducted due to national recommendations for restrictions regarding the COVID-19 Pandemic (e.g. social distancing, sheltering in place) in an effort to limit this patient's exposure and mitigate transmission in our community. All issues noted in this document were discussed and addressed.  A physical exam was not performed with this format.  I connected with Margaret Cruz on 03/20/21 at 1:43 by telephone and verified that I am speaking with the correct person using two identifiers. Margaret Cruz is currently located at home and no one is currently with her during visit. The provider, [name] Done, FNP is located in their office at time of visit.  I discussed the limitations, risks, security and privacy concerns of performing an evaluation and management service by telephone and the availability of in person appointments. I also discussed with the patient that there may be a patient responsible charge related to this service. The patient expressed understanding and agreed to proceed.   History and Present Illness:  Patient states she had flu 2 weeks ago. She still has bad cough, low grade fever with headache. She has slight left ear pain and sore throat.    Review of Systems  Constitutional:  Positive for chills, fever and malaise/fatigue.  HENT:  Positive for congestion, ear pain (left) and sore throat.   Respiratory:  Positive for cough and sputum production.   Musculoskeletal:  Negative for myalgias.  Neurological:  Positive for dizziness and headaches.    Observations/Objective: Alert and oriented- answers all questions appropriately No distress Deep cough Voice hoarse   Assessment and Plan: CHEYANNA Cruz in today with chief complaint of No chief complaint on file.   1. Bronchitis 1. Take meds as prescribed 2. Use a cool mist humidifier especially during the  winter months and when heat has been humid. 3. Use saline nose sprays frequently 4. Saline irrigations of the nose can be very helpful if done frequently.  * 4X daily for 1 week*  * Use of a nettie pot can be helpful with this. Follow directions with this* 5. Drink plenty of fluids 6. Keep thermostat turn down low 7.For any cough or congestion  Use plain Mucinex- regular strength or max strength is fine   * Children- consult with Pharmacist for dosing 8. For fever or aces or pains- take tylenol or ibuprofen appropriate for age and weight.  * for fevers greater than 101 orally you may alternate ibuprofen and tylenol every  3 hours.    - promethazine-dextromethorphan (PROMETHAZINE-DM) 6.25-15 MG/5ML syrup; Take 5 mLs by mouth 4 (four) times daily as needed for cough.  Dispense: 118 mL; Refill: 0 - cefdinir (OMNICEF) 300 MG capsule; Take 1 capsule (300 mg total) by mouth 2 (two) times daily. 1 po BID  Dispense: 20 capsule; Refill: 0 - predniSONE (DELTASONE) 20 MG tablet; Take 2 tablets (40 mg total) by mouth daily with breakfast for 5 days. 2 po daily for 5 days  Dispense: 10 tablet; Refill: 0   Follow Up Instructions: prn    I discussed the assessment and treatment plan with the patient. The patient was provided an opportunity to ask questions and all were answered. The patient agreed with the plan and demonstrated an understanding of the instructions.   The patient was advised to call back or seek an in-person evaluation if the symptoms worsen or if the condition fails to improve as anticipated.  The  above assessment and management plan was discussed with the patient. The patient verbalized understanding of and has agreed to the management plan. Patient is aware to call the clinic if symptoms persist or worsen. Patient is aware when to return to the clinic for a follow-up visit. Patient educated on when it is appropriate to go to the emergency department.   Time call ended:  1:55  I  provided 12 minutes of  non face-to-face time during this encounter.    [name] Done, FNP

## 2021-03-21 ENCOUNTER — Ambulatory Visit: Payer: No Typology Code available for payment source | Admitting: Internal Medicine

## 2021-07-04 ENCOUNTER — Encounter: Payer: Self-pay | Admitting: Nurse Practitioner

## 2021-07-04 ENCOUNTER — Ambulatory Visit (INDEPENDENT_AMBULATORY_CARE_PROVIDER_SITE_OTHER): Payer: No Typology Code available for payment source | Admitting: Nurse Practitioner

## 2021-07-04 VITALS — BP 132/80 | HR 69 | Temp 98.0°F | Resp 20 | Ht 64.0 in | Wt 251.0 lb

## 2021-07-04 DIAGNOSIS — K582 Mixed irritable bowel syndrome: Secondary | ICD-10-CM | POA: Diagnosis not present

## 2021-07-04 DIAGNOSIS — G4709 Other insomnia: Secondary | ICD-10-CM

## 2021-07-04 DIAGNOSIS — Z0001 Encounter for general adult medical examination with abnormal findings: Secondary | ICD-10-CM

## 2021-07-04 DIAGNOSIS — G2581 Restless legs syndrome: Secondary | ICD-10-CM

## 2021-07-04 DIAGNOSIS — G43109 Migraine with aura, not intractable, without status migrainosus: Secondary | ICD-10-CM

## 2021-07-04 DIAGNOSIS — F3341 Major depressive disorder, recurrent, in partial remission: Secondary | ICD-10-CM

## 2021-07-04 DIAGNOSIS — R600 Localized edema: Secondary | ICD-10-CM

## 2021-07-04 DIAGNOSIS — Z0000 Encounter for general adult medical examination without abnormal findings: Secondary | ICD-10-CM

## 2021-07-04 DIAGNOSIS — M25571 Pain in right ankle and joints of right foot: Secondary | ICD-10-CM

## 2021-07-04 DIAGNOSIS — M797 Fibromyalgia: Secondary | ICD-10-CM

## 2021-07-04 MED ORDER — DICLOFENAC SODIUM 75 MG PO TBEC: 75.0000 mg | DELAYED_RELEASE_TABLET | Freq: Two times a day (BID) | ORAL | 1 refills | Status: DC

## 2021-07-04 MED ORDER — PRAMIPEXOLE DIHYDROCHLORIDE 1.5 MG PO TABS: 1.5000 mg | ORAL_TABLET | Freq: Three times a day (TID) | ORAL | 1 refills | Status: DC

## 2021-07-04 NOTE — Patient Instructions (Signed)
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
## 2021-07-05 ENCOUNTER — Other Ambulatory Visit: Payer: No Typology Code available for payment source

## 2021-07-06 LAB — THYROID PANEL WITH TSH
Free Thyroxine Index: 1.9 (ref 1.2–4.9)
T3 Uptake Ratio: 24 % (ref 24–39)
T4, Total: 7.9 ug/dL (ref 4.5–12.0)
TSH: 2.4 u[IU]/mL (ref 0.450–4.500)

## 2021-07-06 LAB — CBC WITH DIFFERENTIAL/PLATELET
Basophils Absolute: 0 10*3/uL (ref 0.0–0.2)
Basos: 1 %
EOS (ABSOLUTE): 0.1 10*3/uL (ref 0.0–0.4)
Eos: 2 %
Hematocrit: 42.7 % (ref 34.0–46.6)
Hemoglobin: 13.9 g/dL (ref 11.1–15.9)
Immature Grans (Abs): 0 10*3/uL (ref 0.0–0.1)
Immature Granulocytes: 0 %
Lymphocytes Absolute: 2 10*3/uL (ref 0.7–3.1)
Lymphs: 38 %
MCH: 28.4 pg (ref 26.6–33.0)
MCHC: 32.6 g/dL (ref 31.5–35.7)
MCV: 87 fL (ref 79–97)
Monocytes Absolute: 0.3 10*3/uL (ref 0.1–0.9)
Monocytes: 6 %
Neutrophils Absolute: 2.7 10*3/uL (ref 1.4–7.0)
Neutrophils: 53 %
Platelets: 202 10*3/uL (ref 150–450)
RBC: 4.9 x10E6/uL (ref 3.77–5.28)
RDW: 12.7 % (ref 11.7–15.4)
WBC: 5.2 10*3/uL (ref 3.4–10.8)

## 2021-07-06 LAB — CMP14+EGFR
ALT: 10 IU/L (ref 0–32)
AST: 11 IU/L (ref 0–40)
Albumin/Globulin Ratio: 2.1 (ref 1.2–2.2)
Albumin: 4 g/dL (ref 3.8–4.8)
Alkaline Phosphatase: 71 IU/L (ref 44–121)
BUN/Creatinine Ratio: 14 (ref 9–23)
BUN: 12 mg/dL (ref 6–24)
Bilirubin Total: 0.5 mg/dL (ref 0.0–1.2)
CO2: 23 mmol/L (ref 20–29)
Calcium: 9.4 mg/dL (ref 8.7–10.2)
Chloride: 104 mmol/L (ref 96–106)
Creatinine, Ser: 0.84 mg/dL (ref 0.57–1.00)
Globulin, Total: 1.9 g/dL (ref 1.5–4.5)
Glucose: 73 mg/dL (ref 70–99)
Potassium: 4.7 mmol/L (ref 3.5–5.2)
Sodium: 140 mmol/L (ref 134–144)
Total Protein: 5.9 g/dL — ABNORMAL LOW (ref 6.0–8.5)
eGFR: 85 mL/min/{1.73_m2} (ref >59)

## 2021-07-06 LAB — LIPID PANEL
Chol/HDL Ratio: 3.1 ratio (ref 0.0–4.4)
Cholesterol, Total: 181 mg/dL (ref 100–199)
HDL: 59 mg/dL (ref >39)
LDL Chol Calc (NIH): 98 mg/dL (ref 0–99)
Triglycerides: 140 mg/dL (ref 0–149)
VLDL Cholesterol Cal: 24 mg/dL (ref 5–40)

## 2021-07-12 DIAGNOSIS — K439 Ventral hernia without obstruction or gangrene: Secondary | ICD-10-CM

## 2021-07-31 ENCOUNTER — Ambulatory Visit (INDEPENDENT_AMBULATORY_CARE_PROVIDER_SITE_OTHER): Payer: No Typology Code available for payment source | Admitting: General Surgery

## 2021-07-31 ENCOUNTER — Other Ambulatory Visit: Payer: Self-pay

## 2021-07-31 ENCOUNTER — Encounter: Payer: Self-pay | Admitting: General Surgery

## 2021-07-31 VITALS — BP 150/93 | HR 100 | Temp 98.6°F | Resp 14 | Ht 64.0 in | Wt 253.0 lb

## 2021-07-31 DIAGNOSIS — R1011 Right upper quadrant pain: Secondary | ICD-10-CM | POA: Insufficient documentation

## 2021-07-31 DIAGNOSIS — K439 Ventral hernia without obstruction or gangrene: Secondary | ICD-10-CM | POA: Diagnosis not present

## 2021-07-31 NOTE — Patient Instructions (Addendum)
Will order a CT scan and then determine the next steps.  ?Likely a Laparoscopic repair will be the best option. ? ?Laparoscopic Ventral Hernia Repair ?Laparoscopic ventral hernia repairis a procedure to fix a bulge of tissue that pushes through a weak area of muscle in the abdomen (ventral hernia). A ventral hernia may be: ?Above the belly button. This is called an epigastric hernia. ?At the belly button. This is called an umbilical hernia. ?At the incision site from previous abdominal surgery. This is called an incisional hernia. ?You may have this procedure as emergency surgery if part of your intestine gets trapped inside the hernia and starts to lose its blood supply (strangulation). ?Laparoscopic surgery is done through small incisions using a thin surgical telescope with a light and camera (laparoscope). During surgery, your surgeon will use images from the laparoscope to guide the procedure. A mesh screen will be placed in the hernia to close the opening and strengthen the abdominal wall. ?Tell a health care provider about: ?Any allergies you have. ?All medicines you are taking, including vitamins, herbs, eye drops, creams, and over-the-counter medicines. ?Any problems you or family members have had with anesthetic medicines. ?Any blood disorders you have. ?Any surgeries you have had. ?Any medical conditions you have. ?Whether you are pregnant or may be pregnant. ?What are the risks? ?Generally, this is a safe procedure. However, problems may occur, including: ?Infection. ?Bleeding. ?Damage to nearby structures or organs in the abdomen. ?Trouble urinating or having a bowel movement after surgery. ?Blood clots. ?The hernia coming back after surgery. ?Fluid buildup in the area of the hernia. ?In some cases, your health care provider may need to switch from a laparoscopic procedure to a procedure that is done through a single, larger incision in the abdomen (open procedure). You may need an open procedure  if: ?You have a hernia that is difficult to repair. ?Your organs are hard to see with the laparoscope. ?You have bleeding problems during the laparoscopic procedure. ?What happens before the procedure? ?Staying hydrated ?Follow instructions from your health care provider about hydration, which may include: ?Up to 2 hours before the procedure - you may continue to drink clear liquids, such as water, clear fruit juice, black coffee, and plain tea. ? ?Eating and drinking restrictions ?Follow instructions from your health care provider about eating and drinking, which may include: ?8 hours before the procedure - stop eating heavy meals or foods, such as meat, fried foods, or fatty foods. ?6 hours before the procedure - stop eating light meals or foods, such as toast or cereal. ?6 hours before the procedure - stop drinking milk or drinks that contain milk. ?2 hours before the procedure - stop drinking clear liquids. ?Medicines ?Ask your health care provider about: ?Changing or stopping your regular medicines. This is especially important if you are taking diabetes medicines or blood thinners. ?Taking medicines such as aspirin and ibuprofen. These medicines can thin your blood. Do not take these medicines unless your health care provider tells you to take them. ?Taking over-the-counter medicines, vitamins, herbs, and supplements. ?Tests ?You may need to have tests before the procedure, such as: ?Blood tests. ?Urine tests. ?Abdominal ultrasound. ?Chest X-ray. ?Electrocardiogram (ECG). ?General instructions ?You may be asked to take a laxative or do an enema to empty your bowel before surgery (bowel prep). ?Do not use any products that contain nicotine or tobacco for at least 4 weeks before the procedure. These products include cigarettes, chewing tobacco, and vaping devices, such as e-cigarettes.  If you need help quitting, ask your health care provider. ?Ask your health care provider: ?How your surgery site will be  marked. ?What steps will be taken to help prevent infection. These steps may include: ?Removing hair at the surgery site. ?Washing skin with a germ-killing soap. ?Receiving antibiotic medicine. ?Plan to have a responsible adult take you home from the hospital or clinic. ?If you will be going home right after the procedure, plan to have a responsible adult care for you for the time you are told. This is important. ?What happens during the procedure? ? ?An IV will be inserted into one of your veins. ?You will be given one or more of the following: ?A medicine to help you relax (sedative). ?A medicine to numb the area (local anesthetic). ?A medicine to make you fall asleep (general anesthetic). ?A small incision will be made in your abdomen. A hollow metal tube (trocar) will be placed through the incision. ?A tube will be placed through the trocar to inflate your abdomen with carbon dioxide. This makes it easier for your surgeon to see inside your abdomen during the repair. ?A laparoscope will be inserted into your abdomen through the trocar. The laparoscope will send images to a monitor in the operating room. ?Other trocars will be put through other small incisions in your abdomen. The surgical instruments needed for the procedure will be placed through these trocars. ?The tissue or intestines that make up the hernia will be moved back into place. ?The edges of the hernia may be stitched (sutured) together. ?A piece of mesh will be used to close the hernia. Sutures, clips, or staples will be used to keep the mesh in place. ?A bandage (dressing) or skin glue will be put over the incisions. ?The procedure may vary among health care providers and hospitals. ?What happens after the procedure? ?Your blood pressure, heart rate, breathing rate, and blood oxygen level will be monitored until you leave the hospital or clinic. ?You will continue to receive fluids and medicines through an IV. Your IV will be removed when you can  drink clear fluids. ?You will be given pain medicine as needed. ?You will be encouraged to get up and walk around as soon as possible. ?You will be shown how to do deep breathing exercises to help prevent a lung infection. ?If you were given a sedative during the procedure, it can affect you for several hours. Do not drive or operate machinery until your health care provider says that it is safe. ?Summary ?Laparoscopic ventral hernia is an operation to fix a hernia using small incisions. ?Tell your health care provider about other medical conditions that you have and about all the medicines that you are taking. ?Follow instructions from your health care provider about eating and drinking before the procedure. ?Plan to have a responsible adult take you home from the hospital or clinic. ?After the procedure, you will be encouraged to walk as soon as possible. You will also be taught how to do deep breathing exercises. ?This information is not intended to replace advice given to you by your health care provider. Make sure you discuss any questions you have with your health care provider. ?Document Revised: 11/20/2019 Document Reviewed: 11/20/2019 ?Elsevier Patient Education ? [place]. ? ?
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
## 2021-08-03 ENCOUNTER — Ambulatory Visit (HOSPITAL_BASED_OUTPATIENT_CLINIC_OR_DEPARTMENT_OTHER)
Admission: RE | Admit: 2021-08-03 | Discharge: 2021-08-03 | Disposition: A | Discharge status: Home / Self Care | Payer: No Typology Code available for payment source | Source: Ambulatory Visit | Attending: General Surgery | Admitting: General Surgery

## 2021-08-03 ENCOUNTER — Encounter (HOSPITAL_BASED_OUTPATIENT_CLINIC_OR_DEPARTMENT_OTHER): Payer: Self-pay

## 2021-08-03 DIAGNOSIS — R1011 Right upper quadrant pain: Secondary | ICD-10-CM | POA: Diagnosis present

## 2021-08-03 DIAGNOSIS — K439 Ventral hernia without obstruction or gangrene: Secondary | ICD-10-CM | POA: Diagnosis present

## 2021-08-03 MED ORDER — IOHEXOL 300 MG/ML  SOLN
100.0000 mL | Freq: Once | INTRAMUSCULAR | Status: AC | PRN
  Administered 2021-08-03: 85 mL via INTRAVENOUS

## 2021-08-07 NOTE — Progress Notes (Signed)
CT results given to patient. No hernia recurrence. Has some thickening of the right oblique muscle related to scarring. Likely the scarring and pulling or straining the muscle is causing her pain. Suggested she do gentle stretching throughout the day, lidocaine patches, and massage. Also suggested physical therapy for scar tissue release, chronic pain if her interventions do not help. Her PCP can refer her if needed in the distant future. If she decides she wants to see PT in the next few weeks we can refer her.

## 2021-09-07 ENCOUNTER — Encounter (INDEPENDENT_AMBULATORY_CARE_PROVIDER_SITE_OTHER): Payer: Self-pay

## 2021-10-19 ENCOUNTER — Ambulatory Visit (INDEPENDENT_AMBULATORY_CARE_PROVIDER_SITE_OTHER): Payer: No Typology Code available for payment source | Admitting: Family Medicine

## 2021-10-19 ENCOUNTER — Encounter: Payer: Self-pay | Admitting: Family Medicine

## 2021-10-19 VITALS — BP 122/76 | HR 67 | Temp 98.8°F | Ht 64.0 in | Wt 250.0 lb

## 2021-10-19 DIAGNOSIS — B351 Tinea unguium: Secondary | ICD-10-CM

## 2021-10-19 DIAGNOSIS — Z79899 Other long term (current) drug therapy: Secondary | ICD-10-CM

## 2021-10-19 MED ORDER — TERBINAFINE HCL 250 MG PO TABS: 250.0000 mg | ORAL_TABLET | Freq: Every day | ORAL | 0 refills | Status: AC
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
## 2021-11-03 ENCOUNTER — Encounter: Payer: Self-pay | Admitting: Nurse Practitioner

## 2021-11-03 ENCOUNTER — Ambulatory Visit (INDEPENDENT_AMBULATORY_CARE_PROVIDER_SITE_OTHER): Payer: No Typology Code available for payment source | Admitting: Nurse Practitioner

## 2021-11-03 VITALS — BP 132/84 | HR 75 | Temp 98.2°F | Ht 64.0 in | Wt 253.6 lb

## 2021-11-03 DIAGNOSIS — L03115 Cellulitis of right lower limb: Secondary | ICD-10-CM | POA: Diagnosis not present

## 2021-11-03 MED ORDER — DOXYCYCLINE HYCLATE 100 MG PO TABS: 100.0000 mg | ORAL_TABLET | Freq: Two times a day (BID) | ORAL | 0 refills | Status: DC

## 2021-11-03 MED ORDER — IBUPROFEN 600 MG PO TABS: 600.0000 mg | ORAL_TABLET | Freq: Three times a day (TID) | ORAL | 0 refills | Status: DC | PRN

## 2021-11-03 NOTE — Patient Instructions (Signed)
Cellulitis, Adult  Cellulitis is a skin infection. The infected area is often warm, red, swollen, and sore. It occurs most often in the arms and lower legs. It is very important to get treated for this condition. What are the causes? This condition is caused by bacteria. The bacteria enter through a break in the skin, such as a cut, burn, insect bite, open sore, or crack. What increases the risk? This condition is more likely to occur in people who: Have a weak body defense system (immune system). Have open cuts, burns, bites, or scrapes on the skin. Are older than 51 years of age. Have a blood sugar problem (diabetes). Have a long-lasting (chronic) liver disease (cirrhosis) or kidney disease. Are very overweight (obese). Have a skin problem, such as: Itchy rash (eczema). Slow movement of blood in the veins (venous stasis). Fluid buildup below the skin (edema). Have been treated with high-energy rays (radiation). Use IV drugs. What are the signs or symptoms? Symptoms of this condition include: Skin that is: Red. Streaking. Spotting. Swollen. Sore or painful when you touch it. Warm. A fever. Chills. Blisters. How is this diagnosed? This condition is diagnosed based on: Medical history. Physical exam. Blood tests. Imaging tests. How is this treated? Treatment for this condition may include: Medicines to treat infections or allergies. Home care, such as: Rest. Placing cold or warm cloths (compresses) on the skin. Hospital care, if the condition is very bad. Follow these instructions at home: Medicines Take over-the-counter and prescription medicines only as told by your doctor. If you were prescribed an antibiotic medicine, take it as told by your doctor. Do not stop taking it even if you start to feel better. General instructions  Drink enough fluid to keep your pee (urine) pale yellow. Do not touch or rub the infected area. Raise (elevate) the infected area above  the level of your heart while you are sitting or lying down. Place cold or warm cloths on the area as told by your doctor. Keep all follow-up visits as told by your doctor. This is important. Contact a doctor if: You have a fever. You do not start to get better after 1-2 days of treatment. Your bone or joint under the infected area starts to hurt after the skin has healed. Your infection comes back. This can happen in the same area or another area. You have a swollen bump in the area. You have new symptoms. You feel ill and have muscle aches and pains. Get help right away if: Your symptoms get worse. You feel very sleepy. You throw up (vomit) or have watery poop (diarrhea) for a long time. You see red streaks coming from the area. Your red area gets larger. Your red area turns dark in color. These symptoms may represent a serious problem that is an emergency. Do not wait to see if the symptoms will go away. Get medical help right away. Call your local emergency services (911 in the [place] Do not drive yourself to the hospital. Summary Cellulitis is a skin infection. The area is often warm, red, swollen, and sore. This condition is treated with medicines, rest, and cold and warm cloths. Take all medicines only as told by your doctor. Tell your doctor if symptoms do not start to get better after 1-2 days of treatment. This information is not intended to replace advice given to you by your health care provider. Make sure you discuss any questions you have with your health care provider. Document Revised: 01/12/2021 Document   Reviewed: 01/12/2021 Elsevier Patient Education  2023 Elsevier Inc.  

## 2021-11-03 NOTE — Progress Notes (Signed)
Acute Office Visit  Subjective:     Patient ID: Margaret Cruz, female    DOB: [birth year], 51 y.o.   MRN: [id number]  Chief Complaint  Patient presents with   Fall    Patient states she fell and hit her right leg x 2 days ago.  Right lower leg swelling, redness and burning.     Fall The accident occurred 3 to 5 days ago. Fall occurred: decorating yard for birthday party. She fell from an unknown height. She landed on Grass. There was no blood loss. The point of impact was the right knee. Pain location: right shine. The pain is at a severity of 6/10. The pain is moderate. The symptoms are aggravated by movement and pressure on injury. Pertinent negatives include no numbness. She has tried ice and elevation for the symptoms.  Knee Pain  The incident occurred 2 days ago. The incident occurred in the yard. The injury mechanism was a fall. The pain is present in the right knee. The pain is at a severity of 6/10. The pain is moderate. The pain has been Constant since onset. Pertinent negatives include no numbness. She reports no foreign bodies present. She has tried ice and elevation for the symptoms. The treatment provided moderate relief.   Reviewed intake note. Margaret Cruz is a 51 y.o. female who presents with erythema, tenderness, and pain.  Location: Right lower shin Onset: acute  Duration: 3 days and symptoms are worsening  Associated symptoms: edema  Recent treatment: OTC medications Functional status affected: yes Hard to walk empty cup feet. Peripheral pulses intact.  Patient denies fever, chills, nausea or vomiting.  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   HENT: Negative.    Eyes: Negative.   Respiratory: Negative.    Gastrointestinal: Negative.   Genitourinary: Negative.   Musculoskeletal:  Positive for falls and joint pain.  Neurological:  Negative for numbness.  All other systems reviewed and are negative.       Objective:    BP 132/84   Pulse 75   Temp 98.2 F (36.8 C) (Temporal)   Ht '5\' 4"'$  (1.626 m)   Wt 253 lb 9.6 oz (115 kg)   SpO2 100%   BMI 43.53 kg/m  BP Readings from Last 3 Encounters:  11/03/21 132/84  10/19/21 122/76  07/31/21 (!) 150/93   Wt Readings from Last 3 Encounters:  11/03/21 253 lb 9.6 oz (115 kg)  10/19/21 250 lb (113.4 kg)  07/31/21 253 lb (114.8 kg)      Physical Exam Vitals and nursing note reviewed.  Constitutional:      Appearance: Normal appearance.  HENT:     Head: Normocephalic.     Right Ear: External ear normal.     Left Ear: External ear normal.     Nose: Nose normal.  Cardiovascular:     Rate and Rhythm: Normal rate and regular rhythm.     Pulses: Normal pulses.  Pulmonary:     Effort: Pulmonary effort is  normal.     Breath sounds: Normal breath sounds.  Abdominal:     General: Bowel sounds are normal.  Musculoskeletal:        General: Swelling present.     Right knee: Erythema present. Decreased range of motion. Tenderness present.     Right lower leg: Tenderness present.       Legs:     Comments: Right knee pain and tenderness, right lower shin cellulitis and edema.  Skin:    General: Skin is warm.     Findings: Bruising and erythema present.  Neurological:     General: No focal deficit present.     Mental Status: She is alert and oriented to person, place, and time.  Psychiatric:        Behavior: Behavior normal.     No results found for any visits on  11/03/21.      Assessment & Plan:  Patient presents with right lower extremity cellulitis and knee pain from a fall in the past 3 days.  Symptoms are not well resolved.  Provided education to patient with printed handouts given.  Patient knows to elevate foot, ice compress, complete all medication as prescribed.  I started patient on doxycycline to treat cellulitis.  Follow-up in 5 days.  Patient knows to go to the emergency department over the weekend if symptoms gets worse. Problem List Items Addressed This Visit   None Visit Diagnoses     Cellulitis of right leg    -  Primary   Relevant Medications   doxycycline (VIBRA-TABS) 100 MG tablet   ibuprofen (ADVIL) 600 MG tablet       Meds ordered this encounter  Medications   doxycycline (VIBRA-TABS) 100 MG tablet    Sig: Take 1 tablet (100 mg total) by mouth 2 (two) times daily.    Dispense:  14 tablet    Refill:  0    Order Specific Question:   Supervising Provider    Answer:   Claretta Fraise [[id number]]   ibuprofen (ADVIL) 600 MG tablet    Sig: Take 1 tablet (600 mg total) by mouth every 8 (eight) hours as needed.    Dispense:  30 tablet    Refill:  0    Order Specific Question:   Supervising Provider    Answer:   Claretta Fraise [[id number]]    Return in about 5 days (around 11/08/2021), or if symptoms worsen or fail to improve.  Ivy Lynn, NP

## 2021-11-09 ENCOUNTER — Encounter: Payer: Self-pay | Admitting: Nurse Practitioner

## 2021-11-09 ENCOUNTER — Ambulatory Visit (INDEPENDENT_AMBULATORY_CARE_PROVIDER_SITE_OTHER): Payer: No Typology Code available for payment source | Admitting: Nurse Practitioner

## 2021-11-09 VITALS — BP 120/75 | HR 67 | Temp 97.7°F | Resp 20 | Ht 64.0 in | Wt 250.0 lb

## 2021-11-09 DIAGNOSIS — L03115 Cellulitis of right lower limb: Secondary | ICD-10-CM | POA: Diagnosis not present

## 2021-11-09 MED ORDER — CEPHALEXIN 500 MG PO CAPS: 500.0000 mg | ORAL_CAPSULE | Freq: Three times a day (TID) | ORAL | 0 refills | Status: DC

## 2021-11-09 MED ORDER — CEFTRIAXONE SODIUM 1 G IJ SOLR
1.0000 g | Freq: Once | INTRAMUSCULAR | Status: AC
  Administered 2021-11-09: 1 g via INTRAMUSCULAR

## 2021-11-09 NOTE — Progress Notes (Signed)
   Subjective:    Patient ID: Margaret Cruz, female    DOB: 03-Apr-1971, 51 y.o.   MRN: [id number]   Chief Complaint: fall follow up  HPI Patient was seen by Je on 11/03/21. She had fallen several days prio and injured her right lower leg. She was dx with cellulitis. She was given doxycycline. Says leg is no better.     Review of Systems  Constitutional:  Negative for diaphoresis.  Eyes:  Negative for pain.  Respiratory:  Negative for shortness of breath.   Cardiovascular:  Negative for chest pain, palpitations and leg swelling.  Gastrointestinal:  Negative for abdominal pain.  Endocrine: Negative for polydipsia.  Skin:  Negative for rash.  Neurological:  Negative for dizziness, weakness and headaches.  Hematological:  Does not bruise/bleed easily.  All other systems reviewed and are negative.      Objective:   Physical Exam Constitutional:      Appearance: Normal appearance. She is obese.  Cardiovascular:     Rate and Rhythm: Normal rate and regular rhythm.     Heart sounds: Normal heart sounds.  Pulmonary:     Effort: Pulmonary effort is normal.     Breath sounds: Normal breath sounds.  Skin:    General: Skin is warm.     Comments: Right lower ext erythematous with 3 plus edema  Neurological:     General: No focal deficit present.     Mental Status: She is alert and oriented to person, place, and time.  Psychiatric:        Mood and Affect: Mood normal.        Behavior: Behavior normal.    BP 120/75   Pulse 67   Temp 97.7 F (36.5 C) (Temporal)   Resp 20   Ht '5\' 4"'$  (1.626 m)   Wt 250 lb (113.4 kg)   SpO2 99%   BMI 42.91 kg/m          Assessment & Plan:   Margaret Cruz in today with chief complaint of Recheck cellulitis   1. Cellulitis of right leg Ice  Elevate RTO Prn - cefTRIAXone (ROCEPHIN) injection 1 g  Meds ordered this encounter  Medications   cefTRIAXone (ROCEPHIN) injection 1 g    Order Specific Question:   Antibiotic  Indication:    Answer:   Cellulitis   cephALEXin (KEFLEX) 500 MG capsule    Sig: Take 1 capsule (500 mg total) by mouth 3 (three) times daily.    Dispense:  30 capsule    Refill:  0    Order Specific Question:   Supervising Provider    Answer:   Caryl Pina A [[id number]]      The above assessment and management plan was discussed with the patient. The patient verbalized understanding of and has agreed to the management plan. Patient is aware to call the clinic if symptoms persist or worsen. Patient is aware when to return to the clinic for a follow-up visit. Patient educated on when it is appropriate to go to the emergency department.   [name] Done, FNP

## 2021-11-09 NOTE — Patient Instructions (Signed)
Cellulitis, Adult  Cellulitis is a skin infection. The infected area is often warm, red, swollen, and sore. It occurs most often in the arms and lower legs. It is very important to get treated for this condition. What are the causes? This condition is caused by bacteria. The bacteria enter through a break in the skin, such as a cut, burn, insect bite, open sore, or crack. What increases the risk? This condition is more likely to occur in people who: Have a weak body defense system (immune system). Have open cuts, burns, bites, or scrapes on the skin. Are older than 51 years of age. Have a blood sugar problem (diabetes). Have a long-lasting (chronic) liver disease (cirrhosis) or kidney disease. Are very overweight (obese). Have a skin problem, such as: Itchy rash (eczema). Slow movement of blood in the veins (venous stasis). Fluid buildup below the skin (edema). Have been treated with high-energy rays (radiation). Use IV drugs. What are the signs or symptoms? Symptoms of this condition include: Skin that is: Red. Streaking. Spotting. Swollen. Sore or painful when you touch it. Warm. A fever. Chills. Blisters. How is this diagnosed? This condition is diagnosed based on: Medical history. Physical exam. Blood tests. Imaging tests. How is this treated? Treatment for this condition may include: Medicines to treat infections or allergies. Home care, such as: Rest. Placing cold or warm cloths (compresses) on the skin. Hospital care, if the condition is very bad. Follow these instructions at home: Medicines Take over-the-counter and prescription medicines only as told by your doctor. If you were prescribed an antibiotic medicine, take it as told by your doctor. Do not stop taking it even if you start to feel better. General instructions  Drink enough fluid to keep your pee (urine) pale yellow. Do not touch or rub the infected area. Raise (elevate) the infected area above  the level of your heart while you are sitting or lying down. Place cold or warm cloths on the area as told by your doctor. Keep all follow-up visits as told by your doctor. This is important. Contact a doctor if: You have a fever. You do not start to get better after 1-2 days of treatment. Your bone or joint under the infected area starts to hurt after the skin has healed. Your infection comes back. This can happen in the same area or another area. You have a swollen bump in the area. You have new symptoms. You feel ill and have muscle aches and pains. Get help right away if: Your symptoms get worse. You feel very sleepy. You throw up (vomit) or have watery poop (diarrhea) for a long time. You see red streaks coming from the area. Your red area gets larger. Your red area turns dark in color. These symptoms may represent a serious problem that is an emergency. Do not wait to see if the symptoms will go away. Get medical help right away. Call your local emergency services (911 in the [place] Do not drive yourself to the hospital. Summary Cellulitis is a skin infection. The area is often warm, red, swollen, and sore. This condition is treated with medicines, rest, and cold and warm cloths. Take all medicines only as told by your doctor. Tell your doctor if symptoms do not start to get better after 1-2 days of treatment. This information is not intended to replace advice given to you by your health care provider. Make sure you discuss any questions you have with your health care provider. Document Revised: 01/12/2021 Document   Reviewed: 01/12/2021 Elsevier Patient Education  2023 Elsevier Inc.  

## 2021-11-10 ENCOUNTER — Other Ambulatory Visit: Payer: Self-pay | Admitting: Nurse Practitioner

## 2021-11-10 ENCOUNTER — Telehealth: Payer: Self-pay | Admitting: Nurse Practitioner

## 2021-11-10 DIAGNOSIS — G2581 Restless legs syndrome: Secondary | ICD-10-CM

## 2021-11-10 NOTE — Telephone Encounter (Signed)
Patient already aware by provider.

## 2021-11-10 NOTE — Telephone Encounter (Signed)
Patient was seen 7/27 about her legs being swollen and wants to know if she can wear compression stockings because the swelling has went down and she is afraid it will get bad again if she does not do something.

## 2021-11-10 NOTE — Telephone Encounter (Signed)
MMM NTBS for chronic FU 30 days given 10/19/21

## 2021-11-13 ENCOUNTER — Encounter: Payer: Self-pay | Admitting: Nurse Practitioner

## 2021-11-13 NOTE — Telephone Encounter (Signed)
LMTCB TO SCHEDULE APPT LETTER MAILED 

## 2021-11-25 ENCOUNTER — Telehealth: Payer: No Typology Code available for payment source | Admitting: Nurse Practitioner

## 2021-11-25 ENCOUNTER — Encounter: Payer: Self-pay | Admitting: Nurse Practitioner

## 2021-11-25 DIAGNOSIS — N61 Mastitis without abscess: Secondary | ICD-10-CM

## 2021-11-25 MED ORDER — SULFAMETHOXAZOLE-TRIMETHOPRIM 800-160 MG PO TABS: 1.0000 | ORAL_TABLET | Freq: Two times a day (BID) | ORAL | 0 refills | Status: DC

## 2021-11-25 NOTE — Progress Notes (Signed)
Virtual Visit Consent   Margaret Cruz, you are scheduled for a virtual visit with a [facility] provider today. Just as with appointments in the office, your consent must be obtained to participate. Your consent will be active for this visit and any virtual visit you may have with one of our providers in the next 365 days. If you have a MyChart account, a copy of this consent can be sent to you electronically.  As this is a virtual visit, video technology does not allow for your provider to perform a traditional examination. This may limit your provider's ability to fully assess your condition. If your provider identifies any concerns that need to be evaluated in person or the need to arrange testing (such as labs, EKG, etc.), we will make arrangements to do so. Although advances in technology are sophisticated, we cannot ensure that it will always work on either your end or our end. If the connection with a video visit is poor, the visit may have to be switched to a telephone visit. With either a video or telephone visit, we are not always able to ensure that we have a secure connection.  By engaging in this virtual visit, you consent to the provision of healthcare and authorize for your insurance to be billed (if applicable) for the services provided during this visit. Depending on your insurance coverage, you may receive a charge related to this service.  I need to obtain your verbal consent now. Are you willing to proceed with your visit today? Margaret Cruz has provided verbal consent on 11/25/2021 for a virtual visit (video or telephone). Gildardo Pounds, NP  Date: 11/25/2021 3:33 PM  Virtual Visit via Video Note   I, Gildardo Pounds, connected with  Margaret Cruz  ([id number], 1970-11-11) on 11/25/21 at  3:15 PM EDT by a video-enabled telemedicine application and verified that I am speaking with the correct person using two identifiers.  Location: Patient: Virtual Visit Location  Patient: Home Provider: Virtual Visit Location Provider: Home Office   I discussed the limitations of evaluation and management by telemedicine and the availability of in person appointments. The patient expressed understanding and agreed to proceed.    History of Present Illness: Margaret Cruz is a 51 y.o. who identifies as a female who was assigned female at birth, and is being seen today for [place].  She was recently treated for LE cellulitis with po keflex (completed course 4 days ago).  Yesterday she started having left breast pain and today she states the breast is hard and swollen with pain, erythema, warmth, green drainage and foul odor coming from breast. Last recorded temp 101. Will send bactrim and she will need to be seen at urgent care or ED for culture if no improvement or symptoms worsen over night.   HPI: HPI  Problems:  Patient Active Problem List   Diagnosis Date Noted   Abdominal pain, right upper quadrant 07/31/2021   Fibromyalgia 02/24/2021   Irritable bowel syndrome (IBS) 02/24/2021   Other insomnia 02/24/2021   Bilateral hand numbness 02/24/2021   Depression 09/08/2020   Osteoarthritis of right foot 12/28/2015   Ventral hernia without obstruction or gangrene 05/25/2015   Edema leg 03/18/2015   Status post-operative repair of hip fracture 12/28/2014   Migraines 08/06/2012    Allergies:  Allergies  Allergen Reactions   No Known Allergies    Medications:  Current Outpatient Medications:    sulfamethoxazole-trimethoprim (BACTRIM DS) 800-160 MG tablet,  Take 1 tablet by mouth 2 (two) times daily., Disp: 14 tablet, Rfl: 0   cephALEXin (KEFLEX) 500 MG capsule, Take 1 capsule (500 mg total) by mouth 3 (three) times daily., Disp: 30 capsule, Rfl: 0   diclofenac (VOLTAREN) 75 MG EC tablet, Take 1 tablet (75 mg total) by mouth 2 (two) times daily., Disp: 180 tablet, Rfl: 1   ibuprofen (ADVIL) 600 MG tablet, Take 1 tablet (600 mg total) by mouth every 8  (eight) hours as needed., Disp: 30 tablet, Rfl: 0   pramipexole (MIRAPEX) 1.5 MG tablet, Take 1 tablet (1.5 mg total) by mouth 3 (three) times daily. (NEEDS TO BE SEEN BEFORE NEXT REFILL), Disp: 90 tablet, Rfl: 1   terbinafine (LAMISIL) 250 MG tablet, Take 1 tablet (250 mg total) by mouth daily., Disp: 84 tablet, Rfl: 0  Observations/Objective: Patient is well-developed, well-nourished in no acute distress..  Head is normocephalic, atraumatic.  No labored breathing.  Speech is clear and coherent with logical content.  Patient is alert and oriented at baseline.    Assessment and Plan: 1. Cellulitis of left breast - sulfamethoxazole-trimethoprim (BACTRIM DS) 800-160 MG tablet; Take 1 tablet by mouth 2 (two) times daily.  Dispense: 14 tablet; Refill: 0 PLS FOLLOW UP AT URGENT CARE TOMORROW FOR EVALUATION OF BREAST FOR CULTURE AND IMAGING. IF FEVER PERSISTS, PAIN IS WORSENING OR BLEEDING FROM BREAST OVERNIGHT YOU WILL NEED TO BE SEEN IN THE EMERGENCY ROOM TONIGHT   Follow Up Instructions: I discussed the assessment and treatment plan with the patient. The patient was provided an opportunity to ask questions and all were answered. The patient agreed with the plan and demonstrated an understanding of the instructions.  A copy of instructions were sent to the patient via MyChart unless otherwise noted below.    The patient was advised to call back or seek an in-person evaluation if the symptoms worsen or if the condition fails to improve as anticipated.  Time:  I spent 12 minutes with the patient via telehealth technology discussing the above problems/concerns.    Gildardo Pounds, NP

## 2021-11-25 NOTE — Patient Instructions (Addendum)
PLS FOLLOW UP AT URGENT CARE TOMORROW FOR EVALUATION OF BREAST FOR CULTURE AND IMAGING. IF FEVER PERSISTS, PAIN IS WORSENING OR BLEEDING FROM BREAST OVERNIGHT YOU WILL NEED TO BE SEEN IN THE EMERGENCY ROOM TONIGHT   [name], thank you for joining Gildardo Pounds, NP for today's virtual visit.  While this provider is not your primary care provider (PCP), if your PCP is located in our provider database this encounter information will be shared with them immediately following your visit.  Consent: (Patient) Margaret Cruz provided verbal consent for this virtual visit at the beginning of the encounter.  Current Medications:  Current Outpatient Medications:    sulfamethoxazole-trimethoprim (BACTRIM DS) 800-160 MG tablet, Take 1 tablet by mouth 2 (two) times daily., Disp: 14 tablet, Rfl: 0   cephALEXin (KEFLEX) 500 MG capsule, Take 1 capsule (500 mg total) by mouth 3 (three) times daily., Disp: 30 capsule, Rfl: 0   diclofenac (VOLTAREN) 75 MG EC tablet, Take 1 tablet (75 mg total) by mouth 2 (two) times daily., Disp: 180 tablet, Rfl: 1   ibuprofen (ADVIL) 600 MG tablet, Take 1 tablet (600 mg total) by mouth every 8 (eight) hours as needed., Disp: 30 tablet, Rfl: 0   pramipexole (MIRAPEX) 1.5 MG tablet, Take 1 tablet (1.5 mg total) by mouth 3 (three) times daily. (NEEDS TO BE SEEN BEFORE NEXT REFILL), Disp: 90 tablet, Rfl: 1   terbinafine (LAMISIL) 250 MG tablet, Take 1 tablet (250 mg total) by mouth daily., Disp: 84 tablet, Rfl: 0   Medications ordered in this encounter:  Meds ordered this encounter  Medications   sulfamethoxazole-trimethoprim (BACTRIM DS) 800-160 MG tablet    Sig: Take 1 tablet by mouth 2 (two) times daily.    Dispense:  14 tablet    Refill:  0    Order Specific Question:   Supervising Provider    Answer:   Sabra Heck, BRIAN [[id number]]     *If you need refills on other medications prior to your next appointment, please contact your pharmacy*  Follow-Up: Call back or  seek an in-person evaluation if the symptoms worsen or if the condition fails to improve as anticipated.  Other Instructions PLS FOLLOW UP AT URGENT CARE TOMORROW FOR EVALUATION OF BREAST FOR CULTURE AND IMAGING. IF FEVER PERSISTS, PAIN IS WORSENING OR BLEEDING FROM BREAST OVERNIGHT YOU WILL NEED TO BE SEEN IN THE EMERGENCY ROOM TONIGHT!!!!   If you have been instructed to have an in-person evaluation today at a local Urgent Care facility, please use the link below. It will take you to a list of all of our available [facility], including address, phone number and hours of operation. Please do not delay care.  [facility]  If you or a family member do not have a primary care provider, use the link below to schedule a visit and establish care. When you choose a [facility] primary care physician or advanced practice provider, you gain a long-term partner in health. Find a Primary Care Provider  Learn more about [facility] in-office and virtual care options: [facility]

## 2021-11-27 ENCOUNTER — Ambulatory Visit: Payer: No Typology Code available for payment source | Admitting: Nurse Practitioner

## 2021-11-27 ENCOUNTER — Encounter: Payer: Self-pay | Admitting: Nurse Practitioner

## 2021-11-27 VITALS — BP 117/76 | HR 69 | Temp 97.9°F | Resp 20 | Ht 64.0 in | Wt 248.0 lb

## 2021-11-27 DIAGNOSIS — N61 Mastitis without abscess: Secondary | ICD-10-CM | POA: Diagnosis not present

## 2021-11-27 MED ORDER — CEFTRIAXONE SODIUM 1 G IJ SOLR
1.0000 g | Freq: Once | INTRAMUSCULAR | Status: AC
  Administered 2021-11-27: 1 g via INTRAMUSCULAR

## 2021-11-27 NOTE — Progress Notes (Addendum)
   Subjective:    Patient ID: Margaret Cruz, female    DOB: 1970-06-26, 51 y.o.   MRN: [id number]   Chief Complaint: Breast Pain (Left breast   Draining)    HPI Patient comes in today c/o left breast pain that started on Friday. She did a video visit and they suggested urgent care. She went to urgent care. Her left breast was red hot and had greenish discharge, they cultured it and started her on bactrim bid for 7 days. She says her breast is better but still draining and sore to there touch.  _ she is also c/o rash on both sides of her neck.this rash started when the breast started hurting.   Review of Systems  Constitutional:  Positive for chills and fever.  Skin:  Positive for rash (bil neck).  All other systems reviewed and are negative.      Objective:   Physical Exam Constitutional:      Appearance: Normal appearance.  Cardiovascular:     Rate and Rhythm: Normal rate and regular rhythm.     Heart sounds: Normal heart sounds.  Pulmonary:     Effort: Pulmonary effort is normal.     Breath sounds: Normal breath sounds.  Chest:  Breasts:    Left: Swelling, inverted nipple, nipple discharge (greenish yellow) and skin change (erythematous with streak going up wards) present.    Skin:    General: Skin is warm and dry.     Comments: Erythematous macular rash on bil sides of neck. No drainage- no pain  Neurological:     General: No focal deficit present.     Mental Status: She is alert and oriented to person, place, and time.    BP 117/76   Pulse 69   Temp 97.9 F (36.6 C) (Temporal)   Resp 20   Ht '5\' 4"'$  (1.626 m)   Wt 248 lb (112.5 kg)   SpO2 100%   BMI 42.57 kg/m         Assessment & Plan:   Margaret Cruz in today with chief complaint of Breast Pain (Left breast   Draining)   1. Mastitis in female Moist compresses Warm showers Waiting on culture results Continue bactrim BID - cefTRIAXone (ROCEPHIN) injection 1 g    The above assessment  and management plan was discussed with the patient. The patient verbalized understanding of and has agreed to the management plan. Patient is aware to call the clinic if symptoms persist or worsen. Patient is aware when to return to the clinic for a follow-up visit. Patient educated on when it is appropriate to go to the emergency department.   [name] Done, FNP

## 2021-11-27 NOTE — Patient Instructions (Signed)
Mastitis  Mastitis is inflammation of the breast tissue. It occurs most often in women who are breastfeeding, but it can also affect other women, and sometimes even men. Mastitis will sometimes go away on its own. A health care provider will help determine if medical treatment is needed. What are the causes? This condition is usually caused by a bacterial infection. Bacteria can enter the breast tissue through cuts, cracks, or openings in the skin. This usually occurs with breastfeeding because of cracked or irritated nipples. Sometimes, mastitis can occur when there are no cuts or openings in the skin. This is usually caused by plugged milk ducts. Plugged milk ducts block the flow of milk in the breast. Other causes include: Nipple piercing. Some forms of breast cancer. What are the signs or symptoms? Symptoms of this condition include: Swelling, redness, tenderness, and pain in an area of the breast. The area may also feel warm to the touch. These symptoms usually affect the upper part of the breast, toward the armpit region. Swelling of the glands under the arm on the same side. Discharge from the nipple. Fatigue, headache, and flu-like muscle aches. Fever and chills. Nausea and vomiting. Rapid pulse. Symptoms usually last 2-5 days. Breast pain and redness are at their worst on days 2 and 3, and they usually go away by day 5. If an infection is left to worsen, a collection of pus, or an abscess, may develop. How is this diagnosed? This condition can usually be diagnosed based on a physical exam and your symptoms. You may also have other tests, such as: Blood tests to check if your body is fighting an infection from bacteria. Mammogram or ultrasound tests to rule out other problems or diseases. Fluid tests. If an abscess has developed, the fluid in the abscess may be removed with a needle. The fluid may be checked to see whether bacteria are present. Breast milk testing. A sample of breast  milk may be tested for bacteria. This is done only when breastfeeding or pumping. How is this treated? Mastitis that occurs with breastfeeding will sometimes go away on its own, so your health care provider may choose to wait 24 hours after first seeing you to decide whether a prescription medicine is needed. If treatment is needed, it may include: Continuing to breastfeed or pump from both breasts to allow adequate milk flow and prevent an abscess from forming. Applying hot or cold compresses to the affected area. Medicine for pain. Antibiotic medicine to treat a bacterial infection. Self-care, such as rest and drinking more fluids. Using a needle to remove fluid from an abscess if one has developed. Follow these instructions at home: Breast care  Keep your nipples clean and dry. If directed, apply heat to the affected area of your breast. Use the heat source that your health care provider or lactation specialist recommends. If directed, place ice on the affected area of your breast. To do this: Put ice in a plastic bag. Place a towel between your skin and the bag. Leave the ice on for 20 minutes, 2-3 times a day. Remove the ice if your skin turns bright red. This is very important. If you cannot feel pain, heat, or cold, you have a greater risk of damage to the area. Breastfeeding and pumping tips Continue to breastfeed your baby on demand. This means feeding your baby whenever he or she is hungry. Ask your health care provider or lactation specialist whether you need to change your breastfeeding or pumping  routine. Avoid using nipple shields for feedings, if possible. Ask a lactation specialist for assistance if needed. Alternate the breast you offer first at each feeding to make sure your baby feeds from both breasts equally. Offer both breasts to your baby at every feeding. Use gentle breast massage during feeding or pumping sessions only as told by your health care provider or  lactation specialist. Avoid allowing your breasts to become very full with milk (engorged). If your breasts are engorged, you can hand express a small amount of milk for comfort. If you are pumping, continue to pump on the same schedule as you were before. In the breast with mastitis, pump until very little milk is coming out. Do not empty your breast. Emptying your breast causes your body to make more milk and can make symptoms worse. Ask your health care provider or lactation specialist whether you need to change your pumping routine. Medicines Take over-the-counter and prescription medicines as told by your health care provider. If you were prescribed an antibiotic medicine, take it as told by your health care provider. Do not stop using the antibiotic even if you start to feel better. Contact a health care provider if: You have pus-like discharge from the breast. You have a fever. Your symptoms do not improve within 2 days of starting treatment. Get help right away if: Your pain and swelling are getting worse. You have pain that is not controlled with medicine. You have a red line extending from the breast toward your armpit. Summary Mastitis is inflammation of the breast tissue. It occurs most often in women who are breastfeeding, but it can also affect non-breastfeeding women and some men. This condition is usually caused by a bacterial infection. This condition may be treated with hot and cold compresses, medicines, self-care, and breastfeeding. If you were prescribed an antibiotic medicine, take it as told by your health care provider. Do not stop using the antibiotic even if you start to feel better. This information is not intended to replace advice given to you by your health care provider. Make sure you discuss any questions you have with your health care provider. Document Revised: 05/05/2021 Document Reviewed: 02/01/2020 Elsevier Patient Education  [place].

## 2021-11-28 ENCOUNTER — Ambulatory Visit: Payer: No Typology Code available for payment source | Admitting: Nurse Practitioner

## 2022-01-08 ENCOUNTER — Other Ambulatory Visit: Payer: Self-pay | Admitting: Nurse Practitioner

## 2022-01-08 DIAGNOSIS — G2581 Restless legs syndrome: Secondary | ICD-10-CM

## 2022-05-29 ENCOUNTER — Ambulatory Visit (HOSPITAL_COMMUNITY)
Admission: RE | Admit: 2022-05-29 | Discharge: 2022-05-29 | Disposition: A | Discharge status: Home / Self Care | Payer: Managed Care, Other (non HMO) | Source: Ambulatory Visit | Attending: Family Medicine | Admitting: Family Medicine

## 2022-05-29 ENCOUNTER — Ambulatory Visit: Payer: Managed Care, Other (non HMO) | Admitting: Family Medicine

## 2022-05-29 ENCOUNTER — Encounter: Payer: Self-pay | Admitting: Family Medicine

## 2022-05-29 VITALS — BP 121/68 | HR 64 | Temp 97.6°F | Ht 64.0 in | Wt 250.2 lb

## 2022-05-29 DIAGNOSIS — M75101 Unspecified rotator cuff tear or rupture of right shoulder, not specified as traumatic: Secondary | ICD-10-CM

## 2022-05-29 MED ORDER — PREDNISONE 10 MG PO TABS: ORAL_TABLET | ORAL | 0 refills | Status: DC

## 2022-05-29 NOTE — Progress Notes (Signed)
Chief Complaint  Patient presents with   Shoulder Pain    Right     HPI  Patient presents today for right shoulder pain. Onset 3 days ago. Felt a pop when she rotated it backward to put keys in her purse that was hanging from the shoulder. No pressure or impact occurred. She uses the arm a lot to care for a grand baby - 58 mos old. Marland Kitchen  PMH: Smoking status noted ROS: Per HPI  Objective: BP 121/68   Pulse 64   Temp 97.6 F (36.4 C)   Ht 5' 4"$  (1.626 m)   Wt 250 lb 3.2 oz (113.5 kg)   SpO2 100%   BMI 42.95 kg/m  Gen: NAD, alert, cooperative with exam HEENT: NCAT, EOMI, PERRL CV: RRR, good S1/S2, no murmur Ext: No edema, warm. Decreased ext. Rotation at 30 degrees. Abduction to 45 degrees.Nl ROM at elbow. Neuro: Alert and oriented, No gross deficits  Assessment and plan:  1. Nontraumatic tear of right rotator cuff, unspecified tear extent     Meds ordered this encounter  Medications   predniSONE (DELTASONE) 10 MG tablet    Sig: Take 5 daily for 2 days followed by 4,3,2 and 1 for 2 days each.    Dispense:  30 tablet    Refill:  0    Orders Placed This Encounter  Procedures   DG Shoulder Right    Standing Status:   Future    Standing Expiration Date:   06/27/2022    Order Specific Question:   Reason for Exam (SYMPTOM  OR DIAGNOSIS REQUIRED)    Answer:   pop and pain anteriorly 2 days ago. No pain and limited ROM    Order Specific Question:   Is the patient pregnant?    Answer:   No    Order Specific Question:   Preferred imaging location?    Answer:   [facility]   Ambulatory referral to Physical Therapy    Referral Priority:   Routine    Referral Type:   Physical Medicine    Referral Reason:   Specialty Services Required    Requested Specialty:   Physical Therapy    Number of Visits Requested:   1    Follow up as needed.  Claretta Fraise, MD

## 2022-05-30 ENCOUNTER — Encounter: Payer: Self-pay | Admitting: Family Medicine

## 2022-06-17 ENCOUNTER — Other Ambulatory Visit: Payer: Self-pay | Admitting: Nurse Practitioner

## 2022-06-17 DIAGNOSIS — G2581 Restless legs syndrome: Secondary | ICD-10-CM

## 2022-07-17 ENCOUNTER — Other Ambulatory Visit: Payer: Self-pay | Admitting: Nurse Practitioner

## 2022-07-17 DIAGNOSIS — M25571 Pain in right ankle and joints of right foot: Secondary | ICD-10-CM

## 2022-07-23 ENCOUNTER — Other Ambulatory Visit (HOSPITAL_COMMUNITY): Payer: Self-pay | Admitting: Nurse Practitioner

## 2022-07-23 DIAGNOSIS — Z1231 Encounter for screening mammogram for malignant neoplasm of breast: Secondary | ICD-10-CM

## 2022-08-01 ENCOUNTER — Ambulatory Visit (HOSPITAL_COMMUNITY): Payer: Managed Care, Other (non HMO)

## 2022-08-10 ENCOUNTER — Ambulatory Visit (HOSPITAL_COMMUNITY)
Admission: RE | Admit: 2022-08-10 | Discharge: 2022-08-10 | Disposition: A | Discharge status: Home / Self Care | Payer: Managed Care, Other (non HMO) | Source: Ambulatory Visit | Attending: Nurse Practitioner | Admitting: Nurse Practitioner

## 2022-08-10 ENCOUNTER — Encounter (HOSPITAL_COMMUNITY): Payer: Self-pay

## 2022-08-10 DIAGNOSIS — Z1231 Encounter for screening mammogram for malignant neoplasm of breast: Secondary | ICD-10-CM

## 2022-08-16 ENCOUNTER — Encounter: Payer: Managed Care, Other (non HMO) | Admitting: Nurse Practitioner

## 2022-09-28 ENCOUNTER — Other Ambulatory Visit: Payer: Self-pay | Admitting: Nurse Practitioner

## 2022-09-28 DIAGNOSIS — M25571 Pain in right ankle and joints of right foot: Secondary | ICD-10-CM

## 2022-10-22 ENCOUNTER — Ambulatory Visit (INDEPENDENT_AMBULATORY_CARE_PROVIDER_SITE_OTHER): Payer: Self-pay | Admitting: Nurse Practitioner

## 2022-10-22 ENCOUNTER — Encounter: Payer: Self-pay | Admitting: Nurse Practitioner

## 2022-10-22 VITALS — BP 134/79 | HR 59 | Temp 98.1°F | Resp 20 | Ht 64.0 in | Wt 252.0 lb

## 2022-10-22 DIAGNOSIS — Z6841 Body Mass Index (BMI) 40.0 and over, adult: Secondary | ICD-10-CM

## 2022-10-22 DIAGNOSIS — R200 Anesthesia of skin: Secondary | ICD-10-CM

## 2022-10-22 DIAGNOSIS — G4709 Other insomnia: Secondary | ICD-10-CM

## 2022-10-22 DIAGNOSIS — K582 Mixed irritable bowel syndrome: Secondary | ICD-10-CM

## 2022-10-22 DIAGNOSIS — M25571 Pain in right ankle and joints of right foot: Secondary | ICD-10-CM

## 2022-10-22 DIAGNOSIS — G43109 Migraine with aura, not intractable, without status migrainosus: Secondary | ICD-10-CM

## 2022-10-22 DIAGNOSIS — F3341 Major depressive disorder, recurrent, in partial remission: Secondary | ICD-10-CM

## 2022-10-22 DIAGNOSIS — G2581 Restless legs syndrome: Secondary | ICD-10-CM

## 2022-10-22 DIAGNOSIS — R600 Localized edema: Secondary | ICD-10-CM

## 2022-10-22 LAB — CMP14+EGFR
ALT: 24 IU/L (ref 0–32)
AST: 14 IU/L (ref 0–40)
Albumin: 4.2 g/dL (ref 3.8–4.9)
Alkaline Phosphatase: 79 IU/L (ref 44–121)
BUN/Creatinine Ratio: 14 (ref 9–23)
BUN: 12 mg/dL (ref 6–24)
Bilirubin Total: 0.6 mg/dL (ref 0.0–1.2)
CO2: 25 mmol/L (ref 20–29)
Calcium: 9.7 mg/dL (ref 8.7–10.2)
Chloride: 103 mmol/L (ref 96–106)
Creatinine, Ser: 0.83 mg/dL (ref 0.57–1.00)
Globulin, Total: 2.1 g/dL (ref 1.5–4.5)
Glucose: 80 mg/dL (ref 70–99)
Potassium: 4.8 mmol/L (ref 3.5–5.2)
Sodium: 140 mmol/L (ref 134–144)
Total Protein: 6.3 g/dL (ref 6.0–8.5)
eGFR: 85 mL/min/{1.73_m2} (ref >59)

## 2022-10-22 LAB — CBC WITH DIFFERENTIAL/PLATELET
Basophils Absolute: 0 10*3/uL (ref 0.0–0.2)
Basos: 0 %
EOS (ABSOLUTE): 0.1 10*3/uL (ref 0.0–0.4)
Eos: 2 %
Hematocrit: 43.2 % (ref 34.0–46.6)
Hemoglobin: 14.1 g/dL (ref 11.1–15.9)
Immature Grans (Abs): 0 10*3/uL (ref 0.0–0.1)
Immature Granulocytes: 0 %
Lymphocytes Absolute: 1.7 10*3/uL (ref 0.7–3.1)
Lymphs: 37 %
MCH: 28.6 pg (ref 26.6–33.0)
MCHC: 32.6 g/dL (ref 31.5–35.7)
MCV: 88 fL (ref 79–97)
Monocytes Absolute: 0.3 10*3/uL (ref 0.1–0.9)
Monocytes: 6 %
Neutrophils Absolute: 2.5 10*3/uL (ref 1.4–7.0)
Neutrophils: 55 %
Platelets: 210 10*3/uL (ref 150–450)
RBC: 4.93 x10E6/uL (ref 3.77–5.28)
RDW: 12.6 % (ref 11.7–15.4)
WBC: 4.6 10*3/uL (ref 3.4–10.8)

## 2022-10-22 LAB — LIPID PANEL
Chol/HDL Ratio: 2.8 ratio (ref 0.0–4.4)
Cholesterol, Total: 202 mg/dL — ABNORMAL HIGH (ref 100–199)
HDL: 71 mg/dL (ref >39)
LDL Chol Calc (NIH): 118 mg/dL — ABNORMAL HIGH (ref 0–99)
Triglycerides: 71 mg/dL (ref 0–149)
VLDL Cholesterol Cal: 13 mg/dL (ref 5–40)

## 2022-10-22 MED ORDER — ZOLPIDEM TARTRATE 10 MG PO TABS: 10.0000 mg | ORAL_TABLET | Freq: Every evening | ORAL | 1 refills | Status: DC | PRN

## 2022-10-22 MED ORDER — PRAMIPEXOLE DIHYDROCHLORIDE 1.5 MG PO TABS: 1.5000 mg | ORAL_TABLET | Freq: Three times a day (TID) | ORAL | 0 refills | Status: DC

## 2022-10-22 MED ORDER — DICLOFENAC SODIUM 75 MG PO TBEC: 75.0000 mg | DELAYED_RELEASE_TABLET | Freq: Two times a day (BID) | ORAL | 0 refills | Status: DC
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
## 2022-10-22 NOTE — Patient Instructions (Signed)
Insomnia Insomnia is a sleep disorder that makes it difficult to fall asleep or stay asleep. Insomnia can cause fatigue, low energy, difficulty concentrating, mood swings, and poor performance at work or school. There are three different ways to classify insomnia: Difficulty falling asleep. Difficulty staying asleep. Waking up too early in the morning. Any type of insomnia can be long-term (chronic) or short-term (acute). Both are common. Short-term insomnia usually lasts for 3 months or less. Chronic insomnia occurs at least three times a week for longer than 3 months. What are the causes? Insomnia may be caused by another condition, situation, or substance, such as: Having certain mental health conditions, such as anxiety and depression. Using caffeine, alcohol, tobacco, or drugs. Having gastrointestinal conditions, such as gastroesophageal reflux disease (GERD). Having certain medical conditions. These include: Asthma. Alzheimer's disease. Stroke. Chronic pain. An overactive thyroid gland (hyperthyroidism). Other sleep disorders, such as restless legs syndrome and sleep apnea. Menopause. Sometimes, the cause of insomnia may not be known. What increases the risk? Risk factors for insomnia include: Gender. Females are affected more often than males. Age. Insomnia is more common as people get older. Stress and certain medical and mental health conditions. Lack of exercise. Having an irregular work schedule. This may include working night shifts and traveling between different time zones. What are the signs or symptoms? If you have insomnia, the main symptom is having trouble falling asleep or having trouble staying asleep. This may lead to other symptoms, such as: Feeling tired or having low energy. Feeling nervous about going to sleep. Not feeling rested in the morning. Having trouble concentrating. Feeling irritable, anxious, or depressed. How is this diagnosed? This condition  may be diagnosed based on: Your symptoms and medical history. Your health care provider may ask about: Your sleep habits. Any medical conditions you have. Your mental health. A physical exam. How is this treated? Treatment for insomnia depends on the cause. Treatment may focus on treating an underlying condition that is causing the insomnia. Treatment may also include: Medicines to help you sleep. Counseling or therapy. Lifestyle adjustments to help you sleep better. Follow these instructions at home: Eating and drinking  Limit or avoid alcohol, caffeinated beverages, and products that contain nicotine and tobacco, especially close to bedtime. These can disrupt your sleep. Do not eat a large meal or eat spicy foods right before bedtime. This can lead to digestive discomfort that can make it hard for you to sleep. Sleep habits  Keep a sleep diary to help you and your health care provider figure out what could be causing your insomnia. Write down: When you sleep. When you wake up during the night. How well you sleep and how rested you feel the next day. Any side effects of medicines you are taking. What you eat and drink. Make your bedroom a dark, comfortable place where it is easy to fall asleep. Put up shades or blackout curtains to block light from outside. Use a white noise machine to block noise. Keep the temperature cool. Limit screen use before bedtime. This includes: Not watching TV. Not using your smartphone, tablet, or computer. Stick to a routine that includes going to bed and waking up at the same times every day and night. This can help you fall asleep faster. Consider making a quiet activity, such as reading, part of your nighttime routine. Try to avoid taking naps during the day so that you sleep better at night. Get out of bed if you are still awake after   15 minutes of trying to sleep. Keep the lights down, but try reading or doing a quiet activity. When you feel  sleepy, go back to bed. General instructions Take over-the-counter and prescription medicines only as told by your health care provider. Exercise regularly as told by your health care provider. However, avoid exercising in the hours right before bedtime. Use relaxation techniques to manage stress. Ask your health care provider to suggest some techniques that may work well for you. These may include: Breathing exercises. Routines to release muscle tension. Visualizing peaceful scenes. Make sure that you drive carefully. Do not drive if you feel very sleepy. Keep all follow-up visits. This is important. Contact a health care provider if: You are tired throughout the day. You have trouble in your daily routine due to sleepiness. You continue to have sleep problems, or your sleep problems get worse. Get help right away if: You have thoughts about hurting yourself or someone else. Get help right away if you feel like you may hurt yourself or others, or have thoughts about taking your own life. Go to your nearest emergency room or: Call 911. Call the National Suicide Prevention Lifeline at 1-800-273-8255 or 988. This is open 24 hours a day. Text the Crisis Text Line at 741741. Summary Insomnia is a sleep disorder that makes it difficult to fall asleep or stay asleep. Insomnia can be long-term (chronic) or short-term (acute). Treatment for insomnia depends on the cause. Treatment may focus on treating an underlying condition that is causing the insomnia. Keep a sleep diary to help you and your health care provider figure out what could be causing your insomnia. This information is not intended to replace advice given to you by your health care provider. Make sure you discuss any questions you have with your health care provider. Document Revised: 03/13/2021 Document Reviewed: 03/13/2021 Elsevier Patient Education  2024 Elsevier Inc.  

## 2022-12-04 ENCOUNTER — Other Ambulatory Visit: Payer: Self-pay | Admitting: Nurse Practitioner

## 2022-12-04 DIAGNOSIS — G4709 Other insomnia: Secondary | ICD-10-CM

## 2022-12-05 NOTE — Telephone Encounter (Signed)
Last office visit 10/22/22 Last refill 10/22/22 #15, 1 refill

## 2023-01-24 ENCOUNTER — Other Ambulatory Visit: Payer: Self-pay | Admitting: Nurse Practitioner

## 2023-01-24 DIAGNOSIS — G4709 Other insomnia: Secondary | ICD-10-CM

## 2023-01-24 DIAGNOSIS — G2581 Restless legs syndrome: Secondary | ICD-10-CM

## 2023-02-26 ENCOUNTER — Other Ambulatory Visit: Payer: Self-pay | Admitting: Nurse Practitioner

## 2023-02-26 DIAGNOSIS — M25571 Pain in right ankle and joints of right foot: Secondary | ICD-10-CM

## 2023-03-17 DIAGNOSIS — Z419 Encounter for procedure for purposes other than remedying health state, unspecified: Secondary | ICD-10-CM | POA: Diagnosis not present

## 2023-03-19 IMAGING — MG MM DIGITAL SCREENING BILAT W/ TOMO AND CAD
6 of 12 series · 6 of 36 positions shown · non-contrast
Comparison: Previous exam(s).

ACR Breast Density Category a: The breast tissue is almost entirely
fatty.

CLINICAL DATA: Screening.

EXAM:
DIGITAL SCREENING BILATERAL MAMMOGRAM WITH TOMOSYNTHESIS AND CAD
TECHNIQUE: Bilateral screening digital craniocaudal and mediolateral oblique
mammograms were obtained. Bilateral screening digital breast
tomosynthesis was performed. The images were evaluated with
computer-aided detection.

[L CC synth-2D (1 of 2)]
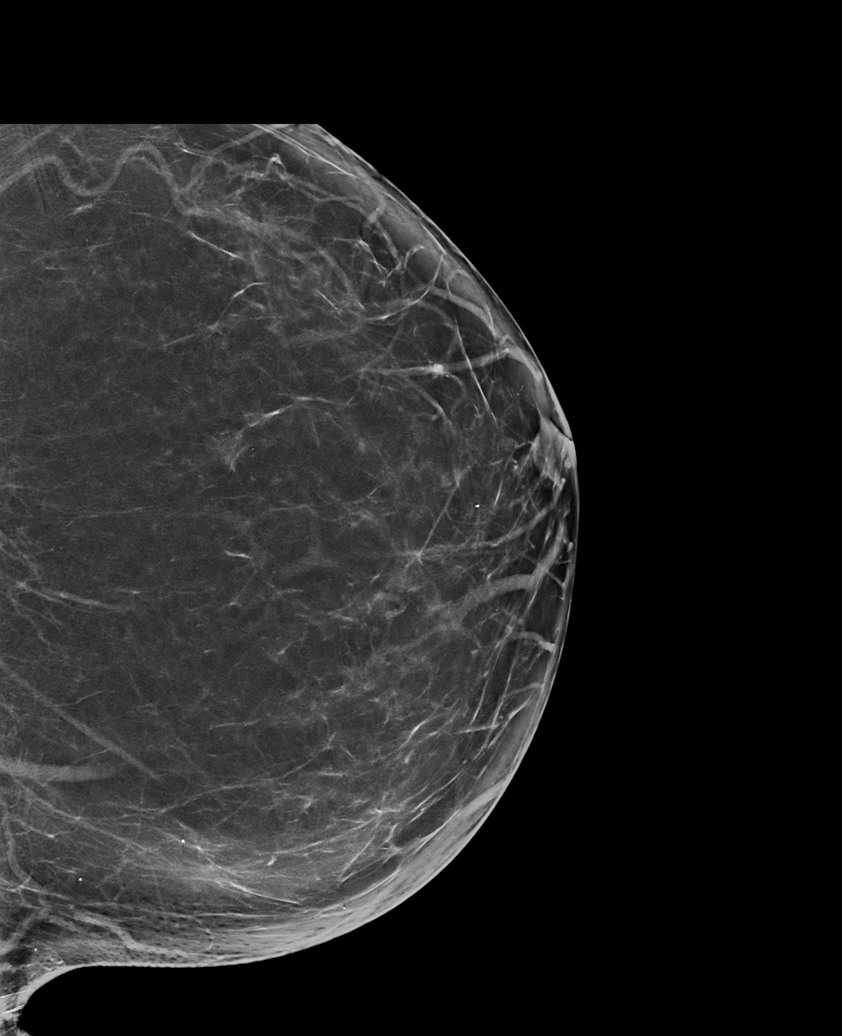

[L MLO synth-2D]
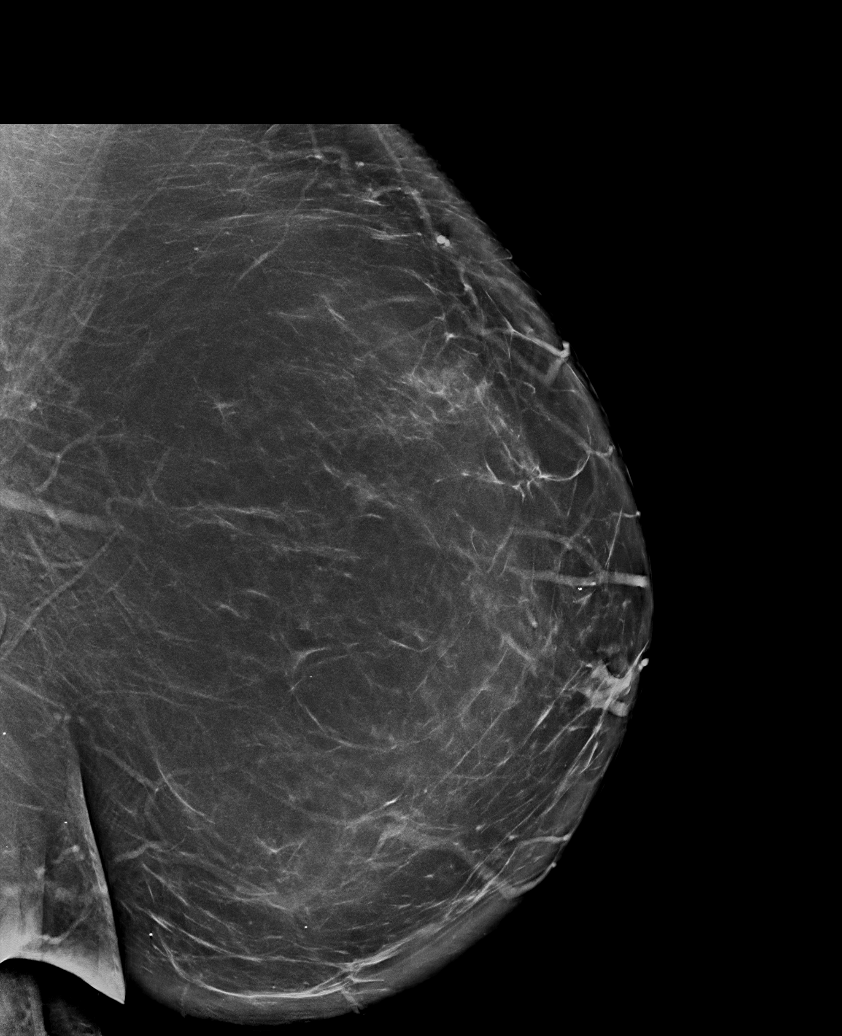

[L CC synth-2D (2 of 2)]
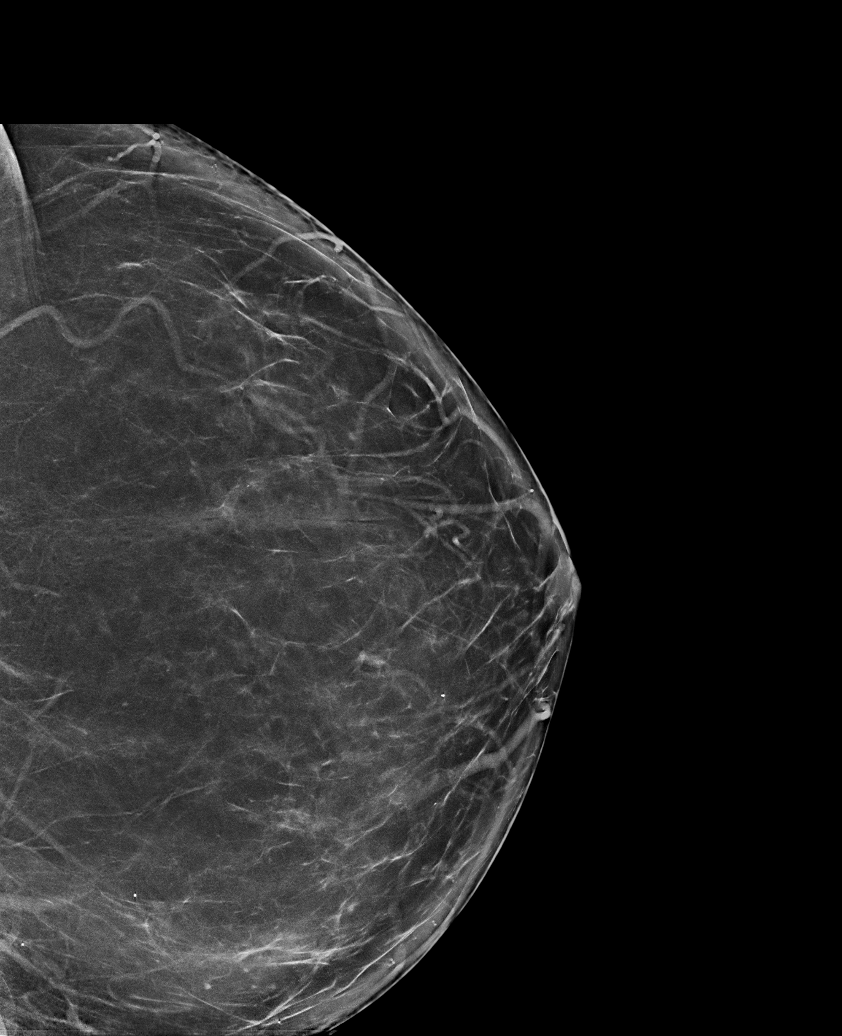

[R MLO synth-2D]
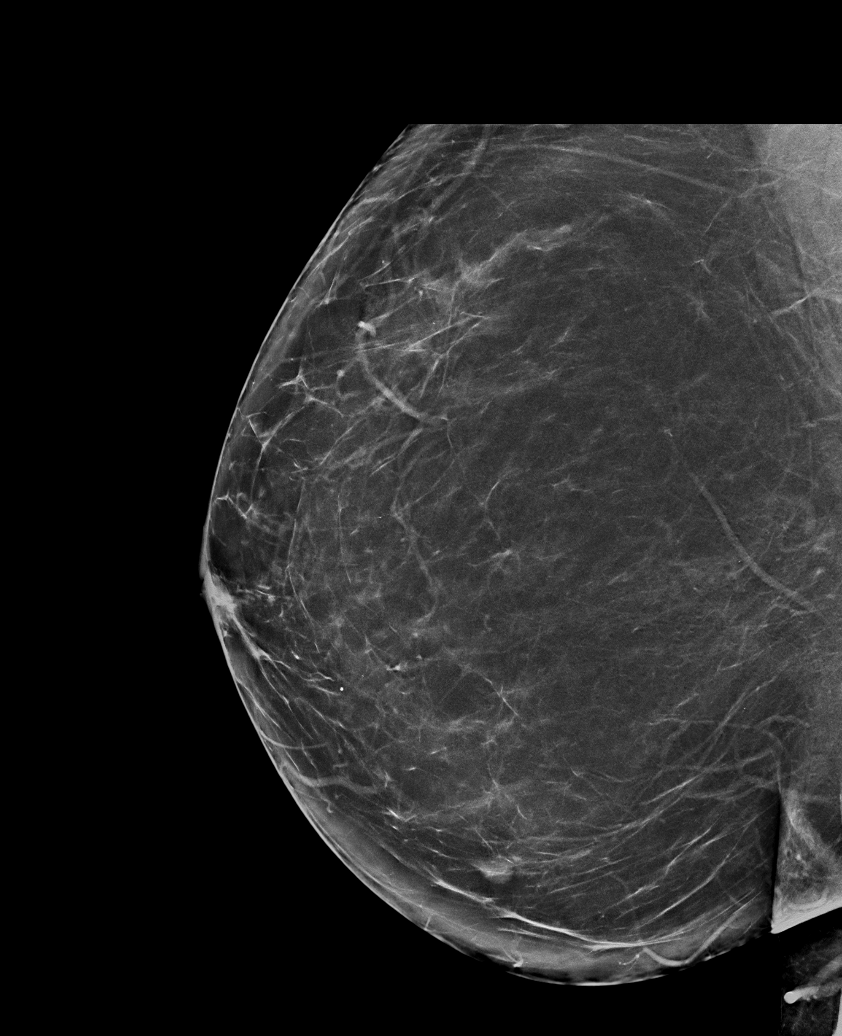

[R CC synth-2D (1 of 2)]
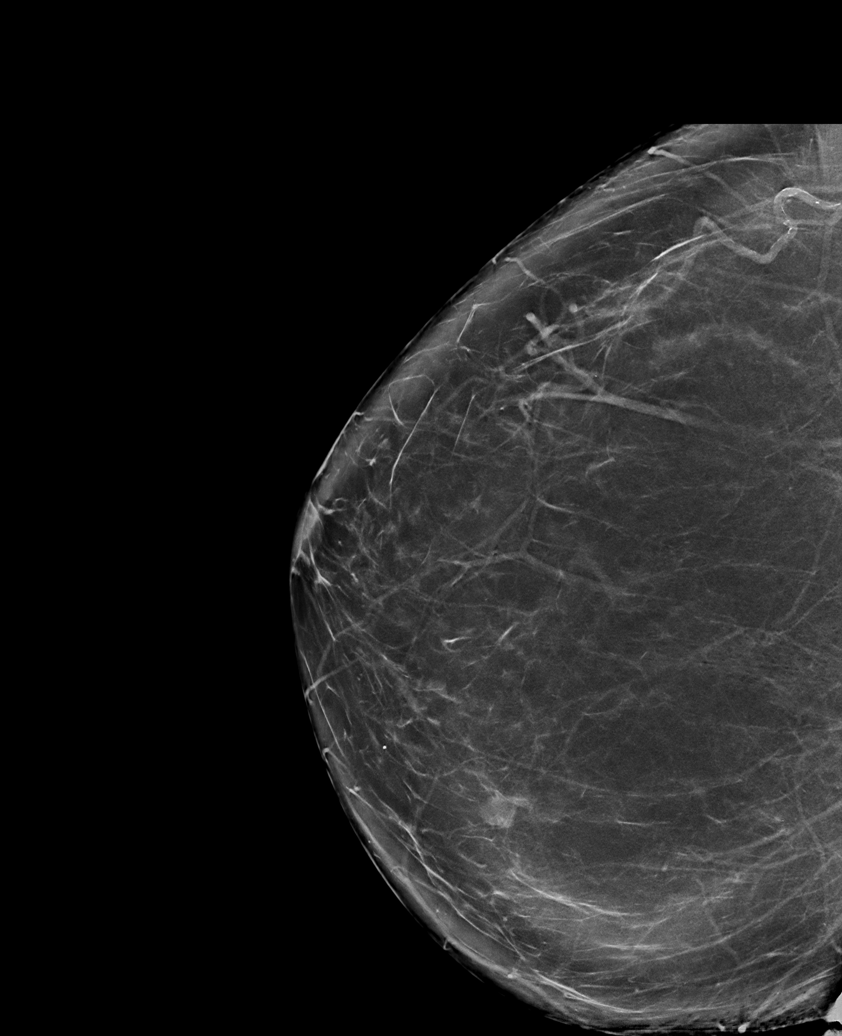

[R CC synth-2D (2 of 2)]
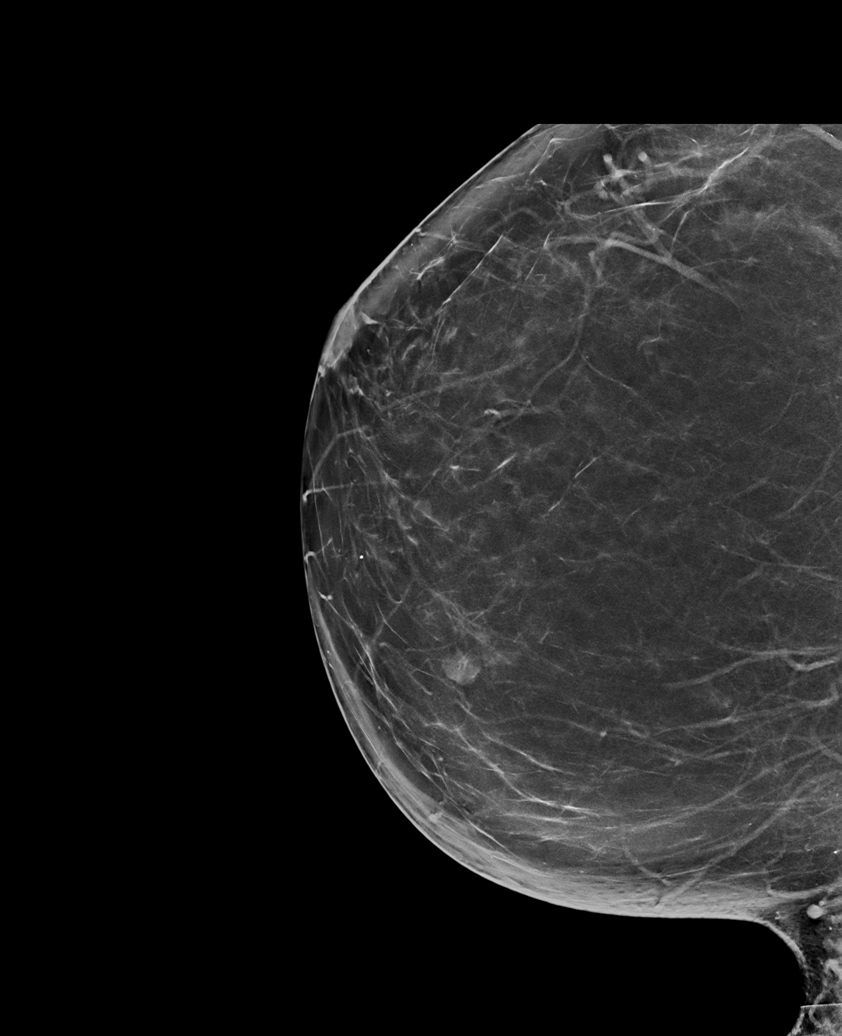

[6 of 36 positions shown; findings below may reference images not displayed]

FINDINGS: There are no findings suspicious for malignancy.
IMPRESSION: No mammographic evidence of malignancy. A result letter of this
screening mammogram will be mailed directly to the patient.

RECOMMENDATION:
Screening mammogram in one year. (Code:0E-3-N98)

BI-RADS CATEGORY  1: Negative.

## 2023-03-26 ENCOUNTER — Encounter: Payer: Self-pay | Admitting: Nurse Practitioner

## 2023-03-26 ENCOUNTER — Ambulatory Visit (INDEPENDENT_AMBULATORY_CARE_PROVIDER_SITE_OTHER): Payer: Medicaid Other | Admitting: Nurse Practitioner

## 2023-03-26 VITALS — BP 124/73 | HR 68 | Temp 97.9°F | Resp 20 | Ht 64.0 in | Wt 261.0 lb

## 2023-03-26 DIAGNOSIS — Z0000 Encounter for general adult medical examination without abnormal findings: Secondary | ICD-10-CM
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
## 2023-03-28 MED ORDER — ESCITALOPRAM OXALATE 10 MG PO TABS: 10.0000 mg | ORAL_TABLET | Freq: Every day | ORAL | 1 refills | Status: DC

## 2023-03-28 NOTE — Telephone Encounter (Signed)
Meds ordered this encounter  Medications   escitalopram (LEXAPRO) 10 MG tablet    Sig: Take 1 tablet (10 mg total) by mouth daily.    Dispense:  90 tablet    Refill:  1    Supervising Provider:   Arville Care A [[id number]]   [name], FNP

## 2023-03-29 LAB — CBC WITH DIFFERENTIAL/PLATELET
Basophils Absolute: 0 10*3/uL (ref 0.0–0.2)
Basos: 0 %
EOS (ABSOLUTE): 0.1 10*3/uL (ref 0.0–0.4)
Eos: 2 %
Hematocrit: 46.2 % (ref 34.0–46.6)
Hemoglobin: 14.6 g/dL (ref 11.1–15.9)
Immature Grans (Abs): 0 10*3/uL (ref 0.0–0.1)
Immature Granulocytes: 0 %
Lymphocytes Absolute: 2.1 10*3/uL (ref 0.7–3.1)
Lymphs: 35 %
MCH: 28.1 pg (ref 26.6–33.0)
MCHC: 31.6 g/dL (ref 31.5–35.7)
MCV: 89 fL (ref 79–97)
Monocytes Absolute: 0.4 10*3/uL (ref 0.1–0.9)
Monocytes: 7 %
Neutrophils Absolute: 3.4 10*3/uL (ref 1.4–7.0)
Neutrophils: 56 %
Platelets: 222 10*3/uL (ref 150–450)
RBC: 5.19 x10E6/uL (ref 3.77–5.28)
RDW: 12.8 % (ref 11.7–15.4)
WBC: 6 10*3/uL (ref 3.4–10.8)

## 2023-03-29 LAB — CMP14+EGFR
ALT: 20 [IU]/L (ref 0–32)
AST: 16 [IU]/L (ref 0–40)
Albumin: 4.3 g/dL (ref 3.8–4.9)
Alkaline Phosphatase: 92 [IU]/L (ref 44–121)
BUN/Creatinine Ratio: 14 (ref 9–23)
BUN: 12 mg/dL (ref 6–24)
Bilirubin Total: 0.5 mg/dL (ref 0.0–1.2)
CO2: 25 mmol/L (ref 20–29)
Calcium: 9.5 mg/dL (ref 8.7–10.2)
Chloride: 104 mmol/L (ref 96–106)
Creatinine, Ser: 0.83 mg/dL (ref 0.57–1.00)
Globulin, Total: 2.3 g/dL (ref 1.5–4.5)
Glucose: 77 mg/dL (ref 70–99)
Potassium: 4.7 mmol/L (ref 3.5–5.2)
Sodium: 142 mmol/L (ref 134–144)
Total Protein: 6.6 g/dL (ref 6.0–8.5)
eGFR: 85 mL/min/{1.73_m2} (ref >59)

## 2023-03-29 LAB — QUANTIFERON-TB GOLD PLUS
QuantiFERON Mitogen Value: >10 [IU]/mL
QuantiFERON Nil Value: 0.03 [IU]/mL
QuantiFERON TB1 Ag Value: 0.04 [IU]/mL
QuantiFERON TB2 Ag Value: 0.03 [IU]/mL
QuantiFERON-TB Gold Plus: NEGATIVE

## 2023-03-29 LAB — LIPID PANEL
Chol/HDL Ratio: 3.1 {ratio} (ref 0.0–4.4)
Cholesterol, Total: 219 mg/dL — ABNORMAL HIGH (ref 100–199)
HDL: 71 mg/dL (ref >39)
LDL Chol Calc (NIH): 126 mg/dL — ABNORMAL HIGH (ref 0–99)
Triglycerides: 126 mg/dL (ref 0–149)
VLDL Cholesterol Cal: 22 mg/dL (ref 5–40)

## 2023-03-29 LAB — THYROID PANEL WITH TSH
Free Thyroxine Index: 2.2 (ref 1.2–4.9)
T3 Uptake Ratio: 24 % (ref 24–39)
T4, Total: 9.1 ug/dL (ref 4.5–12.0)
TSH: 1.95 u[IU]/mL (ref 0.450–4.500)

## 2023-04-17 DIAGNOSIS — Z419 Encounter for procedure for purposes other than remedying health state, unspecified: Secondary | ICD-10-CM | POA: Diagnosis not present

## 2023-04-19 DIAGNOSIS — M5416 Radiculopathy, lumbar region: Secondary | ICD-10-CM | POA: Diagnosis not present

## 2023-04-21 ENCOUNTER — Other Ambulatory Visit: Payer: Self-pay | Admitting: Nurse Practitioner

## 2023-04-21 DIAGNOSIS — G2581 Restless legs syndrome: Secondary | ICD-10-CM

## 2023-04-21 DIAGNOSIS — G4709 Other insomnia: Secondary | ICD-10-CM

## 2023-04-23 ENCOUNTER — Telehealth: Payer: Self-pay

## 2023-04-23 ENCOUNTER — Other Ambulatory Visit: Payer: Self-pay | Admitting: Nurse Practitioner

## 2023-04-23 DIAGNOSIS — G4709 Other insomnia: Secondary | ICD-10-CM

## 2023-04-23 DIAGNOSIS — G2581 Restless legs syndrome: Secondary | ICD-10-CM

## 2023-04-23 MED ORDER — PRAMIPEXOLE DIHYDROCHLORIDE 1.5 MG PO TABS: 1.5000 mg | ORAL_TABLET | Freq: Every day | ORAL | 1 refills | Status: DC

## 2023-04-23 MED ORDER — ZOLPIDEM TARTRATE 10 MG PO TABS: 10.0000 mg | ORAL_TABLET | Freq: Every evening | ORAL | 1 refills | Status: DC | PRN

## 2023-04-23 NOTE — Progress Notes (Signed)
 Meds ordered this encounter  Medications   pramipexole  (MIRAPEX ) 1.5 MG tablet    Sig: Take 1 tablet (1.5 mg total) by mouth at bedtime.    Dispense:  90 tablet    Refill:  1    Supervising Provider:   DETTINGER, JOSHUA A [[id number]]   zolpidem  (AMBIEN ) 10 MG tablet    Sig: Take 1 tablet (10 mg total) by mouth at bedtime as needed. for sleep    Dispense:  30 tablet    Refill:  1    Supervising Provider:   MARYANNE CHEW A [[id number]]   Margaret Gladis, FNP

## 2023-04-23 NOTE — Telephone Encounter (Signed)
 Margaret Cruz (Key: [id number]) Rx #: [id number] Need Help? Call us  at [phone] Status sent iconSent to Plan today Drug Diclofenac  Sodium 75MG  dr tablets ePA cloud logo Form [facility] Medicaid of [place]  Electronic Prior Authorization Request Form [phone] NCPDP) Original Claim Info 1

## 2023-04-23 NOTE — Telephone Encounter (Signed)
 Pharmacy Patient Advocate Encounter  Received notification from [facility] Medicaid that Prior Authorization for Diclofenac Sodium 75MG  dr tablets has been APPROVED from 04/09/23 to 04/22/24   PA #/Case ID/Reference #: [id number]

## 2023-04-25 ENCOUNTER — Ambulatory Visit: Payer: Medicaid Other | Admitting: Nurse Practitioner

## 2023-05-01 ENCOUNTER — Other Ambulatory Visit: Payer: Self-pay | Admitting: Orthopedic Surgery

## 2023-05-01 DIAGNOSIS — M545 Low back pain, unspecified: Secondary | ICD-10-CM

## 2023-05-07 ENCOUNTER — Other Ambulatory Visit (HOSPITAL_COMMUNITY): Payer: Self-pay

## 2023-05-08 ENCOUNTER — Ambulatory Visit
Admission: RE | Admit: 2023-05-08 | Discharge: 2023-05-08 | Disposition: A | Discharge status: Home / Self Care | Payer: Medicaid Other | Source: Ambulatory Visit | Attending: Orthopedic Surgery | Admitting: Orthopedic Surgery

## 2023-05-08 DIAGNOSIS — M545 Low back pain, unspecified: Secondary | ICD-10-CM

## 2023-05-08 DIAGNOSIS — M47816 Spondylosis without myelopathy or radiculopathy, lumbar region: Secondary | ICD-10-CM | POA: Diagnosis not present

## 2023-05-14 ENCOUNTER — Other Ambulatory Visit: Payer: Medicaid Other

## 2023-05-18 DIAGNOSIS — Z419 Encounter for procedure for purposes other than remedying health state, unspecified: Secondary | ICD-10-CM | POA: Diagnosis not present

## 2023-05-20 DIAGNOSIS — M545 Low back pain, unspecified: Secondary | ICD-10-CM | POA: Diagnosis not present

## 2023-06-15 DIAGNOSIS — Z419 Encounter for procedure for purposes other than remedying health state, unspecified: Secondary | ICD-10-CM | POA: Diagnosis not present

## 2023-07-27 DIAGNOSIS — Z419 Encounter for procedure for purposes other than remedying health state, unspecified: Secondary | ICD-10-CM | POA: Diagnosis not present

## 2023-07-30 ENCOUNTER — Ambulatory Visit: Admitting: Orthopedic Surgery

## 2023-08-21 ENCOUNTER — Encounter: Payer: Self-pay | Admitting: Family Medicine

## 2023-08-21 ENCOUNTER — Ambulatory Visit: Admitting: Family Medicine

## 2023-08-21 ENCOUNTER — Ambulatory Visit (INDEPENDENT_AMBULATORY_CARE_PROVIDER_SITE_OTHER)

## 2023-08-21 VITALS — BP 128/76 | HR 74 | Temp 97.6°F | Ht 64.0 in | Wt 259.8 lb

## 2023-08-21 DIAGNOSIS — Z981 Arthrodesis status: Secondary | ICD-10-CM | POA: Diagnosis not present

## 2023-08-21 DIAGNOSIS — M79671 Pain in right foot: Secondary | ICD-10-CM

## 2023-08-21 DIAGNOSIS — M19071 Primary osteoarthritis, right ankle and foot: Secondary | ICD-10-CM | POA: Diagnosis not present

## 2023-08-21 DIAGNOSIS — Z967 Presence of other bone and tendon implants: Secondary | ICD-10-CM

## 2023-08-21 MED ORDER — TRAMADOL HCL 50 MG PO TABS: 50.0000 mg | ORAL_TABLET | Freq: Three times a day (TID) | ORAL | 0 refills | Status: AC | PRN

## 2023-08-21 MED ORDER — KETOROLAC TROMETHAMINE 60 MG/2ML IM SOLN
60.0000 mg | Freq: Once | INTRAMUSCULAR | Status: AC
  Administered 2023-08-21: 60 mg via INTRAMUSCULAR
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
## 2023-08-22 ENCOUNTER — Encounter: Payer: Self-pay | Admitting: Orthopedic Surgery

## 2023-08-26 ENCOUNTER — Ambulatory Visit: Admitting: Nurse Practitioner

## 2023-08-26 DIAGNOSIS — Z419 Encounter for procedure for purposes other than remedying health state, unspecified: Secondary | ICD-10-CM | POA: Diagnosis not present

## 2023-08-27 ENCOUNTER — Ambulatory Visit (INDEPENDENT_AMBULATORY_CARE_PROVIDER_SITE_OTHER): Admitting: Orthopedic Surgery

## 2023-08-27 DIAGNOSIS — I872 Venous insufficiency (chronic) (peripheral): Secondary | ICD-10-CM

## 2023-08-27 DIAGNOSIS — M19171 Post-traumatic osteoarthritis, right ankle and foot: Secondary | ICD-10-CM

## 2023-09-01 DIAGNOSIS — H5213 Myopia, bilateral: Secondary | ICD-10-CM | POA: Diagnosis not present

## 2023-09-10 ENCOUNTER — Encounter: Payer: Self-pay | Admitting: Orthopedic Surgery
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
## 2023-09-16 ENCOUNTER — Other Ambulatory Visit: Payer: Self-pay | Admitting: Nurse Practitioner

## 2023-09-16 DIAGNOSIS — M25571 Pain in right ankle and joints of right foot: Secondary | ICD-10-CM

## 2023-09-16 DIAGNOSIS — G2581 Restless legs syndrome: Secondary | ICD-10-CM

## 2023-09-17 ENCOUNTER — Encounter: Payer: Self-pay | Admitting: Orthopedic Surgery

## 2023-09-18 MED ORDER — PREDNISONE 10 MG PO TABS: 10.0000 mg | ORAL_TABLET | Freq: Every day | ORAL | 0 refills | Status: DC

## 2023-09-26 DIAGNOSIS — Z419 Encounter for procedure for purposes other than remedying health state, unspecified: Secondary | ICD-10-CM | POA: Diagnosis not present

## 2023-10-11 ENCOUNTER — Other Ambulatory Visit: Payer: Self-pay | Admitting: Nurse Practitioner

## 2023-10-11 DIAGNOSIS — M25571 Pain in right ankle and joints of right foot: Secondary | ICD-10-CM

## 2023-10-26 DIAGNOSIS — Z419 Encounter for procedure for purposes other than remedying health state, unspecified: Secondary | ICD-10-CM | POA: Diagnosis not present

## 2023-11-26 DIAGNOSIS — Z419 Encounter for procedure for purposes other than remedying health state, unspecified: Secondary | ICD-10-CM | POA: Diagnosis not present

## 2023-12-27 ENCOUNTER — Telehealth (INDEPENDENT_AMBULATORY_CARE_PROVIDER_SITE_OTHER): Admitting: Family

## 2023-12-27 DIAGNOSIS — J069 Acute upper respiratory infection, unspecified: Secondary | ICD-10-CM | POA: Diagnosis not present

## 2023-12-27 DIAGNOSIS — Z419 Encounter for procedure for purposes other than remedying health state, unspecified: Secondary | ICD-10-CM | POA: Diagnosis not present

## 2023-12-27 MED ORDER — PREDNISONE 10 MG (21) PO TBPK: ORAL_TABLET | ORAL | 0 refills | Status: DC

## 2023-12-27 MED ORDER — FLUTICASONE PROPIONATE 50 MCG/ACT NA SUSP: 2.0000 | Freq: Every day | NASAL | 6 refills | Status: AC

## 2023-12-27 NOTE — Progress Notes (Signed)
 Virtual Visit Consent   Margaret Cruz, you are scheduled for a virtual visit with a [facility] provider today. Just as with appointments in the office, your consent must be obtained to participate. Your consent will be active for this visit and any virtual visit you may have with one of our providers in the next 365 days. If you have a MyChart account, a copy of this consent can be sent to you electronically.  As this is a virtual visit, video technology does not allow for your provider to perform a traditional examination. This may limit your provider's ability to fully assess your condition. If your provider identifies any concerns that need to be evaluated in person or the need to arrange testing (such as labs, EKG, etc.), we will make arrangements to do so. Although advances in technology are sophisticated, we cannot ensure that it will always work on either your end or our end. If the connection with a video visit is poor, the visit may have to be switched to a telephone visit. With either a video or telephone visit, we are not always able to ensure that we have a secure connection.  By engaging in this virtual visit, you consent to the provision of healthcare and authorize for your insurance to be billed (if applicable) for the services provided during this visit. Depending on your insurance coverage, you may receive a charge related to this service.  I need to obtain your verbal consent now. Are you willing to proceed with your visit today? Margaret Cruz has provided verbal consent on 12/27/2023 for a virtual visit (video or telephone). Bari Learn, FNP  Date: 12/27/2023 3:50 PM   Virtual Visit via Video Note   I, Bari Learn, connected with  Margaret Cruz  ([id number], [birth year]) on 12/27/23 at  4:30 PM EDT by a video-enabled telemedicine application and verified that I am speaking with the correct person using two identifiers.  Location: Patient: Virtual Visit Location  Patient: Other: car Provider: Virtual Visit Location Provider: Home Office   I discussed the limitations of evaluation and management by telemedicine and the availability of in person appointments. The patient expressed understanding and agreed to proceed.    History of Present Illness: Margaret Cruz is a 53 y.o. who identifies as a female who was assigned female at birth, and is being seen today for sinus congestion and chest tightness that started two days ago. She reports she did a home COVID test and it was negative.   HPI: Cough This is a new problem. The current episode started in the past 7 days. The problem has been unchanged. The problem occurs every few minutes. The cough is Non-productive. Associated symptoms include chills, headaches, nasal congestion, postnasal drip, rhinorrhea and a sore throat. Pertinent negatives include no ear congestion, ear pain, fever, myalgias or shortness of breath. She has tried rest and OTC cough suppressant for the symptoms. The treatment provided mild relief. There is no history of asthma or COPD.    Problems:  Patient Active Problem List   Diagnosis Date Noted   Class 3 severe obesity due to excess calories without serious comorbidity with body mass index (BMI) of 45.0 to 49.9 in adult 10/22/2022   Fibromyalgia 02/24/2021   Irritable bowel syndrome (IBS) 02/24/2021   Other insomnia 02/24/2021   Bilateral hand numbness 02/24/2021   Depression 09/08/2020   Osteoarthritis of right foot 12/28/2015   Ventral hernia without obstruction or gangrene 05/25/2015   Edema leg  03/18/2015   Status post-operative repair of hip fracture 12/28/2014   Migraines 08/06/2012    Allergies:  Allergies  Allergen Reactions   No Known Allergies    Medications:  Current Outpatient Medications:    fluticasone  (FLONASE ) 50 MCG/ACT nasal spray, Place 2 sprays into both nostrils daily., Disp: 16 g, Rfl: 6   predniSONE  (STERAPRED UNI-PAK 21 TAB) 10 MG (21) TBPK  tablet, Use as directed, Disp: 21 tablet, Rfl: 0   diclofenac  (VOLTAREN ) 75 MG EC tablet, Take 1 tablet by mouth twice daily, Disp: 60 tablet, Rfl: 0   escitalopram  (LEXAPRO ) 10 MG tablet, Take 1 tablet (10 mg total) by mouth daily., Disp: 90 tablet, Rfl: 1   pramipexole  (MIRAPEX ) 1.5 MG tablet, TAKE 1 TABLET BY MOUTH AT BEDTIME, Disp: 90 tablet, Rfl: 0   zolpidem  (AMBIEN ) 10 MG tablet, Take 1 tablet (10 mg total) by mouth at bedtime as needed. for sleep, Disp: 30 tablet, Rfl: 1  Observations/Objective: Patient is well-developed, well-nourished in no acute distress.  Resting comfortably  at home.  Head is normocephalic, atraumatic.  No labored breathing.  Speech is clear and coherent with logical content.  Patient is alert and oriented at baseline.  Nasal congestion  Assessment and Plan: 1. Viral URI (Primary) - predniSONE  (STERAPRED UNI-PAK 21 TAB) 10 MG (21) TBPK tablet; Use as directed  Dispense: 21 tablet; Refill: 0 - fluticasone  (FLONASE ) 50 MCG/ACT nasal spray; Place 2 sprays into both nostrils daily.  Dispense: 16 g; Refill: 6  - Take meds as prescribed - Use a cool mist humidifier  -Use saline nose sprays frequently -Force fluids -For any cough or congestion  Use plain Mucinex- regular strength or max strength is fine -For fever or aces or pains- take tylenol  or ibuprofen . -Throat lozenges if help -Follow up if symptoms worsen or do not improve   Follow Up Instructions: I discussed the assessment and treatment plan with the patient. The patient was provided an opportunity to ask questions and all were answered. The patient agreed with the plan and demonstrated an understanding of the instructions.  A copy of instructions were sent to the patient via MyChart unless otherwise noted below.    The patient was advised to call back or seek an in-person evaluation if the symptoms worsen or if the condition fails to improve as anticipated.    Bari Learn, FNP

## 2023-12-27 NOTE — Patient Instructions (Signed)

## 2023-12-31 ENCOUNTER — Ambulatory Visit: Admitting: Orthopedic Surgery

## 2024-01-06 ENCOUNTER — Other Ambulatory Visit: Payer: Self-pay | Admitting: Nurse Practitioner

## 2024-01-06 ENCOUNTER — Ambulatory Visit: Admitting: Physician Assistant

## 2024-01-06 DIAGNOSIS — G4709 Other insomnia: Secondary | ICD-10-CM

## 2024-01-06 DIAGNOSIS — M25571 Pain in right ankle and joints of right foot: Secondary | ICD-10-CM

## 2024-01-06 DIAGNOSIS — G2581 Restless legs syndrome: Secondary | ICD-10-CM

## 2024-02-07 ENCOUNTER — Ambulatory Visit: Admitting: Nurse Practitioner

## 2024-02-14 ENCOUNTER — Ambulatory Visit: Admitting: Nurse Practitioner

## 2024-02-17 ENCOUNTER — Encounter: Payer: Self-pay | Admitting: Radiology

## 2024-02-25 ENCOUNTER — Ambulatory Visit (INDEPENDENT_AMBULATORY_CARE_PROVIDER_SITE_OTHER): Payer: Self-pay | Admitting: Nurse Practitioner

## 2024-02-25 ENCOUNTER — Encounter: Payer: Self-pay | Admitting: Nurse Practitioner

## 2024-02-25 VITALS — BP 118/77 | HR 69 | Temp 97.6°F | Ht 64.0 in | Wt 250.0 lb

## 2024-02-25 DIAGNOSIS — F3341 Major depressive disorder, recurrent, in partial remission: Secondary | ICD-10-CM | POA: Diagnosis not present

## 2024-02-25 DIAGNOSIS — K582 Mixed irritable bowel syndrome: Secondary | ICD-10-CM

## 2024-02-25 DIAGNOSIS — M797 Fibromyalgia: Secondary | ICD-10-CM | POA: Diagnosis not present

## 2024-02-25 DIAGNOSIS — G2581 Restless legs syndrome: Secondary | ICD-10-CM

## 2024-02-25 DIAGNOSIS — E66813 Obesity, class 3: Secondary | ICD-10-CM

## 2024-02-25 DIAGNOSIS — G43109 Migraine with aura, not intractable, without status migrainosus: Secondary | ICD-10-CM

## 2024-02-25 DIAGNOSIS — Z6841 Body Mass Index (BMI) 40.0 and over, adult: Secondary | ICD-10-CM

## 2024-02-25 DIAGNOSIS — R600 Localized edema: Secondary | ICD-10-CM

## 2024-02-25 LAB — LIPID PANEL

## 2024-02-25 MED ORDER — TOPIRAMATE 50 MG PO TABS: 50.0000 mg | ORAL_TABLET | Freq: Two times a day (BID) | ORAL | 2 refills | Status: AC

## 2024-02-25 MED ORDER — ESCITALOPRAM OXALATE 10 MG PO TABS: 10.0000 mg | ORAL_TABLET | Freq: Every day | ORAL | 1 refills | Status: AC

## 2024-02-25 MED ORDER — PHENTERMINE HCL 37.5 MG PO TABS: 37.5000 mg | ORAL_TABLET | Freq: Every day | ORAL | 2 refills | Status: AC

## 2024-02-25 MED ORDER — PRAMIPEXOLE DIHYDROCHLORIDE 1.5 MG PO TABS: 1.5000 mg | ORAL_TABLET | Freq: Every day | ORAL | 1 refills | Status: AC

## 2024-02-25 NOTE — Patient Instructions (Signed)
Edema  Edema is an abnormal buildup of fluids in the body tissues and under the skin. Swelling of the legs, feet, and ankles is a common symptom that becomes more likely as you get older. Swelling is also common in looser tissues, such as around the eyes. Pressing on the area may make a temporary dent in your skin (pitting edema). This fluid may also accumulate in your lungs (pulmonary edema). There are many possible causes of edema. Eating too much salt (sodium) and being on your feet or sitting for a long time can cause edema in your legs, feet, and ankles. Common causes of edema include: Certain medical conditions, such as heart failure, liver or kidney disease, and cancer. Weak leg blood vessels. An injury. Pregnancy. Medicines. Being obese. Low protein levels in the blood. Hot weather may make edema worse. Edema is usually painless. Your skin may look swollen or shiny. Follow these instructions at home: Medicines Take over-the-counter and prescription medicines only as told by your health care provider. Your health care provider may prescribe a medicine to help your body get rid of extra water (diuretic). Take this medicine if you are told to take it. Eating and drinking Eat a low-salt (low-sodium) diet to reduce fluid as told by your health care provider. Sometimes, eating less salt may reduce swelling. Depending on the cause of your swelling, you may need to limit how much fluid you drink (fluid restriction). General instructions Raise (elevate) the injured area above the level of your heart while you are sitting or lying down. Do not sit still or stand for long periods of time. Do not wear tight clothing. Do not wear garters on your upper legs. Exercise your legs to get your circulation going. This helps to move the fluid back into your blood vessels, and it may help the swelling go down. Wear compression stockings as told by your health care provider. These stockings help to prevent  blood clots and reduce swelling in your legs. It is important that these are the correct size. These stockings should be prescribed by your health care provider to prevent possible injuries. If elastic bandages or wraps are recommended, use them as told by your health care provider. Contact a health care provider if: Your edema does not get better with treatment. You have heart, liver, or kidney disease and have symptoms of edema. You have sudden and unexplained weight gain. Get help right away if: You develop shortness of breath or chest pain. You cannot breathe when you lie down. You develop pain, redness, or warmth in the swollen areas. You have heart, liver, or kidney disease and suddenly get edema. You have a fever and your symptoms suddenly get worse. These symptoms may be an emergency. Get help right away. Call 911. Do not wait to see if the symptoms will go away. Do not drive yourself to the hospital. Summary Edema is an abnormal buildup of fluids in the body tissues and under the skin. Eating too much salt (sodium)and being on your feet or sitting for a long time can cause edema in your legs, feet, and ankles. Raise (elevate) the injured area above the level of your heart while you are sitting or lying down. Follow your health care provider's instructions about diet and how much fluid you can drink. This information is not intended to replace advice given to you by your health care provider. Make sure you discuss any questions you have with your health care provider. Document Revised: 12/05/2020 Document  Reviewed: 12/05/2020 Elsevier Patient Education  2024 [employer].
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
## 2024-02-26 ENCOUNTER — Other Ambulatory Visit (HOSPITAL_COMMUNITY): Payer: Self-pay

## 2024-02-26 ENCOUNTER — Telehealth: Payer: Self-pay | Admitting: Pharmacy Technician

## 2024-02-26 LAB — CBC WITH DIFFERENTIAL/PLATELET
Basophils Absolute: 0 x10E3/uL (ref 0.0–0.2)
Basos: 1 %
EOS (ABSOLUTE): 0.1 x10E3/uL (ref 0.0–0.4)
Eos: 2 %
Hematocrit: 43 % (ref 34.0–46.6)
Hemoglobin: 14 g/dL (ref 11.1–15.9)
Immature Grans (Abs): 0 x10E3/uL (ref 0.0–0.1)
Immature Granulocytes: 0 %
Lymphocytes Absolute: 2.4 x10E3/uL (ref 0.7–3.1)
Lymphs: 37 %
MCH: 28.3 pg (ref 26.6–33.0)
MCHC: 32.6 g/dL (ref 31.5–35.7)
MCV: 87 fL (ref 79–97)
Monocytes Absolute: 0.4 x10E3/uL (ref 0.1–0.9)
Monocytes: 6 %
Neutrophils Absolute: 3.5 x10E3/uL (ref 1.4–7.0)
Neutrophils: 54 %
Platelets: 186 x10E3/uL (ref 150–450)
RBC: 4.95 x10E6/uL (ref 3.77–5.28)
RDW: 12.7 % (ref 11.7–15.4)
WBC: 6.5 x10E3/uL (ref 3.4–10.8)

## 2024-02-26 LAB — CMP14+EGFR
ALT: 24 IU/L (ref 0–32)
AST: 16 IU/L (ref 0–40)
Albumin: 4.2 g/dL (ref 3.8–4.9)
Alkaline Phosphatase: 69 IU/L (ref 49–135)
BUN/Creatinine Ratio: 10 (ref 9–23)
BUN: 8 mg/dL (ref 6–24)
Bilirubin Total: 0.6 mg/dL (ref 0.0–1.2)
CO2: 25 mmol/L (ref 20–29)
Calcium: 9.2 mg/dL (ref 8.7–10.2)
Chloride: 105 mmol/L (ref 96–106)
Creatinine, Ser: 0.8 mg/dL (ref 0.57–1.00)
Globulin, Total: 2.1 g/dL (ref 1.5–4.5)
Glucose: 79 mg/dL (ref 70–99)
Potassium: 4.7 mmol/L (ref 3.5–5.2)
Sodium: 141 mmol/L (ref 134–144)
Total Protein: 6.3 g/dL (ref 6.0–8.5)
eGFR: 89 mL/min/1.73 (ref >59)

## 2024-02-26 LAB — LIPID PANEL
Cholesterol, Total: 151 mg/dL (ref 100–199)
HDL: 55 mg/dL (ref >39)
LDL CALC COMMENT:: 2.7 ratio (ref 0.0–4.4)
LDL Chol Calc (NIH): 78 mg/dL (ref 0–99)
Triglycerides: 98 mg/dL (ref 0–149)
VLDL Cholesterol Cal: 18 mg/dL (ref 5–40)

## 2024-02-26 NOTE — Telephone Encounter (Signed)
 Pharmacy Patient Advocate Encounter   Received notification from Onbase that prior authorization for Phentermine  HCl 37.5MG  tablets is required/requested.   Insurance verification completed.   The patient is insured through [facility].   Per test claim: PA required; PA started via CoverMyMeds. KEY [id number] . Waiting for clinical questions to populate.

## 2024-02-26 NOTE — Telephone Encounter (Signed)
Noted  -LS

## 2024-02-26 NOTE — Telephone Encounter (Signed)
 Pharmacy Patient Advocate Encounter  Received notification from [facility] that Prior Authorization for Phentermine  HCl 37.5MG  tablets has been DENIED.  Full denial letter will be uploaded to the media tab. See denial reason below.     PA #/Case ID/Reference #: [id number]

## 2024-02-27 ENCOUNTER — Ambulatory Visit: Payer: Self-pay | Admitting: Nurse Practitioner

## 2024-02-28 ENCOUNTER — Other Ambulatory Visit: Payer: Self-pay | Admitting: *Deleted

## 2024-02-28 DIAGNOSIS — M25571 Pain in right ankle and joints of right foot: Secondary | ICD-10-CM

## 2024-05-19 ENCOUNTER — Other Ambulatory Visit: Payer: Self-pay | Admitting: Medical Genetics

## 2024-05-20 ENCOUNTER — Inpatient Hospital Stay (HOSPITAL_COMMUNITY): Admission: RE | Admit: 2024-05-20 | Discharge: 2024-05-20 | Payer: Self-pay | Attending: Oncology | Admitting: Oncology

## 2024-05-26 ENCOUNTER — Ambulatory Visit: Admitting: Nurse Practitioner
